# Patient Record
Sex: Female | Born: 2000 | Race: Black or African American | Hispanic: No | Smoking: Never smoker
Health system: Southern US, Community
[De-identification: ages and names within clinical notes are randomized; demographics above are authoritative.]

## PROBLEM LIST (undated history)

## (undated) DIAGNOSIS — K219 Gastro-esophageal reflux disease without esophagitis: Secondary | ICD-10-CM

## (undated) DIAGNOSIS — M549 Dorsalgia, unspecified: Secondary | ICD-10-CM

## (undated) DIAGNOSIS — R1110 Vomiting, unspecified: Secondary | ICD-10-CM

## (undated) DIAGNOSIS — F419 Anxiety disorder, unspecified: Secondary | ICD-10-CM

## (undated) DIAGNOSIS — R51 Headache: Secondary | ICD-10-CM

## (undated) DIAGNOSIS — F329 Major depressive disorder, single episode, unspecified: Secondary | ICD-10-CM

## (undated) DIAGNOSIS — J302 Other seasonal allergic rhinitis: Secondary | ICD-10-CM

## (undated) DIAGNOSIS — R519 Headache, unspecified: Secondary | ICD-10-CM

## (undated) DIAGNOSIS — R002 Palpitations: Secondary | ICD-10-CM

## (undated) DIAGNOSIS — K591 Functional diarrhea: Secondary | ICD-10-CM

## (undated) DIAGNOSIS — N946 Dysmenorrhea, unspecified: Secondary | ICD-10-CM

## (undated) DIAGNOSIS — F32A Depression, unspecified: Secondary | ICD-10-CM

## (undated) DIAGNOSIS — F509 Eating disorder, unspecified: Secondary | ICD-10-CM

## (undated) HISTORY — DX: Headache: R51

## (undated) HISTORY — DX: Palpitations: R00.2

## (undated) HISTORY — DX: Dysmenorrhea, unspecified: N94.6

## (undated) HISTORY — PX: DENTAL SURGERY: SHX609

## (undated) HISTORY — DX: Depression, unspecified: F32.A

## (undated) HISTORY — DX: Gastro-esophageal reflux disease without esophagitis: K21.9

## (undated) HISTORY — DX: Functional diarrhea: K59.1

## (undated) HISTORY — DX: Eating disorder, unspecified: F50.9

## (undated) HISTORY — DX: Anxiety disorder, unspecified: F41.9

## (undated) HISTORY — DX: Major depressive disorder, single episode, unspecified: F32.9

## (undated) HISTORY — DX: Vomiting, unspecified: R11.10

## (undated) HISTORY — DX: Headache, unspecified: R51.9

## (undated) HISTORY — DX: Dorsalgia, unspecified: M54.9

---
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
## 2018-02-18 ENCOUNTER — Ambulatory Visit: Payer: Medicaid Other

## 2018-02-23 ENCOUNTER — Ambulatory Visit: Payer: Medicaid Other

## 2018-02-26 ENCOUNTER — Ambulatory Visit: Payer: Medicaid Other

## 2018-02-26 ENCOUNTER — Telehealth: Payer: Self-pay | Admitting: Obstetrics & Gynecology

## 2018-02-26 DIAGNOSIS — Z30013 Encounter for initial prescription of injectable contraceptive: Secondary | ICD-10-CM

## 2018-03-01 ENCOUNTER — Other Ambulatory Visit: Payer: Self-pay

## 2018-03-01 DIAGNOSIS — Z30013 Encounter for initial prescription of injectable contraceptive: Secondary | ICD-10-CM

## 2018-03-01 MED ORDER — MEDROXYPROGESTERONE ACETATE 150 MG/ML IM SUSY: 150.0000 mg | PREFILLED_SYRINGE | INTRAMUSCULAR | 0 refills | Status: DC

## 2018-03-01 NOTE — Telephone Encounter (Signed)
Done

## 2018-03-01 NOTE — Telephone Encounter (Signed)
Pt states [facility] advised her she didn't have any refills of her birth control. Cb#[phone]

## 2018-03-01 NOTE — Telephone Encounter (Signed)
LMVM to notify pt she is past due for Annual. She was granted a refill 11/26/17 to give her time to get in for an appointment. Advised to contact office to schedule appointment for AE.

## 2018-03-01 NOTE — Telephone Encounter (Signed)
Patient is schedule 03/04/18 with AMS for Annual exam. Patient requesting refill on Depo.

## 2018-03-04 ENCOUNTER — Ambulatory Visit: Payer: Medicaid Other | Admitting: Obstetrics and Gynecology

## 2018-03-16 ENCOUNTER — Ambulatory Visit: Payer: Medicaid Other | Admitting: Obstetrics and Gynecology

## 2018-04-13 ENCOUNTER — Ambulatory Visit (INDEPENDENT_AMBULATORY_CARE_PROVIDER_SITE_OTHER): Payer: Medicaid Other | Admitting: Obstetrics and Gynecology

## 2018-04-13 ENCOUNTER — Other Ambulatory Visit (HOSPITAL_COMMUNITY)
Admission: RE | Admit: 2018-04-13 | Discharge: 2018-04-13 | Disposition: A | Discharge status: Home / Self Care | Payer: Medicaid Other | Source: Ambulatory Visit | Attending: Obstetrics and Gynecology | Admitting: Obstetrics and Gynecology

## 2018-04-13 ENCOUNTER — Encounter: Payer: Self-pay | Admitting: Obstetrics and Gynecology

## 2018-04-13 VITALS — BP 88/40 | HR 99 | Wt 81.0 lb

## 2018-04-13 DIAGNOSIS — Z113 Encounter for screening for infections with a predominantly sexual mode of transmission: Secondary | ICD-10-CM

## 2018-04-13 DIAGNOSIS — Z3042 Encounter for surveillance of injectable contraceptive: Secondary | ICD-10-CM

## 2018-04-13 DIAGNOSIS — Z01419 Encounter for gynecological examination (general) (routine) without abnormal findings: Secondary | ICD-10-CM

## 2018-04-13 DIAGNOSIS — Z30013 Encounter for initial prescription of injectable contraceptive: Secondary | ICD-10-CM

## 2018-04-13 DIAGNOSIS — Z0000 Encounter for general adult medical examination without abnormal findings: Secondary | ICD-10-CM | POA: Diagnosis not present

## 2018-04-13 MED ORDER — MEDROXYPROGESTERONE ACETATE 150 MG/ML IM SUSY: 150.0000 mg | PREFILLED_SYRINGE | INTRAMUSCULAR | 3 refills | Status: DC

## 2018-04-13 NOTE — Progress Notes (Signed)
Gynecology Annual Exam  PCP: Patient, No Pcp Per  Chief Complaint:  Chief Complaint  Patient presents with  . Gynecologic Exam    History of Present Illness: Patient is a 17 y.o. G0P0000 presents for annual exam. The patient has no complaints today.   LMP: Patient's last menstrual period was 03/19/2018. Absent menses other than occasional breakthrough bleeding on depo provera.  The patient is sexually active. She currently uses Depo-Provera injections for contraception. She denies dyspareunia.    The patient wears seatbelts: yes.  The patient has regular exercise: not asked.    The patient denies current symptoms of depression.    Review of Systems: ROS  Past Medical History:  Past Medical History:  Diagnosis Date  . Acid reflux   . Anxiety   . Backache   . Depression   . Diarrhea, functional   . Dysmenorrhea   . Eating disorder   . Head ache   . Heart palpitations   . Seasonal allergies   . Vomiting     Past Surgical History:  Past Surgical History:  Procedure Laterality Date  . colonscopy  06/2015  . DENTAL SURGERY      Gynecologic History:  Patient's last menstrual period was 03/19/2018. Contraception: Depo-Provera injections Last Pap: Results were: N/A under 21  Obstetric History: G0P0000  Family History:  Family History  Problem Relation Age of Onset  . Lung cancer Maternal Grandmother   . Stroke Maternal Grandmother   . Diabetes Maternal Grandfather   . Stroke Maternal Grandfather   . Hypertension Maternal Grandfather   . Cancer Other        LUNG; SKIN CA IN SITU    Social History:  Social History   Socioeconomic History  . Marital status: Single    Spouse name: Not on file  . Number of children: 0  . Years of education: 3212  . Highest education level: Not on file  Occupational History  . Occupation: Consulting civil engineerstudent  Social Needs  . Financial resource strain: Not on file  . Food insecurity:    Worry: Not on file    Inability: Not on  file  . Transportation needs:    Medical: Not on file    Non-medical: Not on file  Tobacco Use  . Smoking status: Never Smoker  . Smokeless tobacco: Never Used  Substance and Sexual Activity  . Alcohol use: No  . Drug use: No  . Sexual activity: Yes    Birth control/protection: Pill  Lifestyle  . Physical activity:    Days per week: Not on file    Minutes per session: Not on file  . Stress: Not on file  Relationships  . Social connections:    Talks on phone: Not on file    Gets together: Not on file    Attends religious service: Not on file    Active member of club or organization: Not on file    Attends meetings of clubs or organizations: Not on file    Relationship status: Not on file  . Intimate partner violence:    Fear of current or ex partner: Not on file    Emotionally abused: Not on file    Physically abused: Not on file    Forced sexual activity: Not on file  Other Topics Concern  . Not on file  Social History Narrative  . Not on file    Allergies:  Allergies  Allergen Reactions  . Buspirone Other (See Comments)  agitation     Medications: Prior to Admission medications   Medication Sig Start Date End Date Taking? Authorizing Provider  ibuprofen (ADVIL,MOTRIN) 600 MG tablet Take 1 tablet (600 mg total) by mouth every 8 (eight) hours as needed. 02/18/17  Yes Emily Filbert, MD  ipratropium (ATROVENT) 0.03 % nasal spray Place 2 sprays into both nostrils 3 (three) times daily. 02/08/17  Yes [provider]  medroxyPROGESTERone Acetate 150 MG/ML SUSY Inject 1 mL (150 mg total) into the muscle every 3 (three) months. 03/01/18  Yes Vena Austria, MD  NEXIUM 40 MG capsule Take 40 mg by mouth daily. 07/13/16  Yes [provider]  albuterol (PROVENTIL HFA;VENTOLIN HFA) 108 (90 Base) MCG/ACT inhaler Inhale 2 puffs into the lungs every 4 (four) hours as needed for wheezing or shortness of breath.     [provider]    medroxyPROGESTERone (PROVERA) 10 MG tablet Take 1 tablet (10 mg total) by mouth daily. Patient not taking: Reported on 04/13/2018 02/18/17   Emily Filbert, MD    Physical Exam Vitals: Blood pressure (!) 88/40, pulse 99, weight 81 lb (36.7 kg), last menstrual period 03/19/2018.  General: NAD HEENT: normocephalic, anicteric Thyroid: no enlargement, no palpable nodules Pulmonary: No increased work of breathing, CTAB Cardiovascular: RRR, distal pulses 2+ Breast: Breast symmetrical, no tenderness, no palpable nodules or masses, no skin or nipple retraction present, no nipple discharge.  No axillary or supraclavicular lymphadenopathy. Abdomen: NABS, soft, non-tender, non-distended.  Umbilicus without lesions.  No hepatomegaly, splenomegaly or masses palpable. No evidence of hernia  Genitourinary:  External: Normal external female genitalia.  Normal urethral meatus, normal Bartholin's and Skene's glands.    Vagina: Normal vaginal mucosa, no evidence of prolapse.    Cervix: Grossly normal in appearance, no bleeding  Uterus: Non-enlarged, mobile, normal contour.  No CMT  Adnexa: ovaries non-enlarged, no adnexal masses  Rectal: deferred  Lymphatic: no evidence of inguinal lymphadenopathy Extremities: no edema, erythema, or tenderness Neurologic: Grossly intact Psychiatric: mood appropriate, affect full  Female chaperone present for pelvic and breast  portions of the physical exam    Assessment: 17 y.o. G0P0000 routine annual exam  Plan: Problem List Items Addressed This Visit    None    Visit Diagnoses    Encounter for gynecological examination without abnormal finding    -  Primary   Relevant Orders   Cervicovaginal ancillary only   HEP, RPR, HIV Panel   Surveillance for Depo-Provera contraception       Routine screening for STI (sexually transmitted infection)       Relevant Orders   Cervicovaginal ancillary only   HEP, RPR, HIV Panel   Encounter for initial  prescription of injectable contraceptive       Pt wants to try depo. RTO for UPT and first injection. Abstinence condoms till depo start and 7 days after. Increase calcium. F/u prn.    Relevant Medications   medroxyPROGESTERone Acetate 150 MG/ML SUSY      1)Gardasil Series discussed and if applicable offered to patient - Patient has previously completed 3 shot series   2) STI screening  wasoffered and accepted  3)  ASCCP guidelines and rational discussed.  Patient opts for starting age 25, 3 year screening interval  4) Contraception - the patient is currently using  Depo-Provera injections.  She is happy with her current form of contraception and plans to continue We discussed safe sex practices to reduce her furture risk of STI's.    5)  Return in about 1 year (around 04/14/2019) for annnual (continue nurse visit every 3 months depo provera).   Vena Austria, MD, [facility], [facility] 04/13/2018, 10:03 PM

## 2018-04-14 LAB — CERVICOVAGINAL ANCILLARY ONLY
Chlamydia: NEGATIVE
Neisseria Gonorrhea: NEGATIVE
TRICH (WINDOWPATH): NEGATIVE

## 2018-04-15 LAB — HEP, RPR, HIV PANEL
HIV SCREEN 4TH GENERATION: NONREACTIVE
Hepatitis B Surface Ag: NEGATIVE
RPR Ser Ql: NONREACTIVE

## 2018-04-16 ENCOUNTER — Ambulatory Visit (INDEPENDENT_AMBULATORY_CARE_PROVIDER_SITE_OTHER): Payer: Medicaid Other

## 2018-04-16 DIAGNOSIS — Z3202 Encounter for pregnancy test, result negative: Secondary | ICD-10-CM | POA: Diagnosis not present

## 2018-04-16 DIAGNOSIS — N912 Amenorrhea, unspecified: Secondary | ICD-10-CM

## 2018-04-16 DIAGNOSIS — Z3042 Encounter for surveillance of injectable contraceptive: Secondary | ICD-10-CM | POA: Diagnosis not present

## 2018-04-16 LAB — POCT URINE PREGNANCY: Preg Test, Ur: NEGATIVE

## 2018-04-16 MED ORDER — MEDROXYPROGESTERONE ACETATE 150 MG/ML IM SUSP
150.0000 mg | Freq: Once | INTRAMUSCULAR | Status: AC
  Administered 2018-04-16: 150 mg via INTRAMUSCULAR

## 2018-04-16 NOTE — Progress Notes (Signed)
Pt presents for Depo Provera injection today. Last Received 11/26/17. Patient is not currently having menses. Pregnancy test performed per protocol (Neg). Injection given IM RUOQ.

## 2018-06-14 ENCOUNTER — Encounter: Payer: Self-pay | Admitting: Emergency Medicine

## 2018-06-14 ENCOUNTER — Other Ambulatory Visit: Payer: Self-pay

## 2018-06-14 ENCOUNTER — Emergency Department: Payer: Medicaid Other

## 2018-06-14 ENCOUNTER — Emergency Department
Admission: EM | Admit: 2018-06-14 | Discharge: 2018-06-14 | Disposition: A | Discharge status: Home / Self Care | Payer: Medicaid Other | Attending: Emergency Medicine | Admitting: Emergency Medicine

## 2018-06-14 DIAGNOSIS — Y999 Unspecified external cause status: Secondary | ICD-10-CM | POA: Insufficient documentation

## 2018-06-14 DIAGNOSIS — Z79899 Other long term (current) drug therapy: Secondary | ICD-10-CM | POA: Diagnosis not present

## 2018-06-14 DIAGNOSIS — W540XXA Bitten by dog, initial encounter: Secondary | ICD-10-CM | POA: Diagnosis not present

## 2018-06-14 DIAGNOSIS — Y929 Unspecified place or not applicable: Secondary | ICD-10-CM | POA: Diagnosis not present

## 2018-06-14 DIAGNOSIS — S61451A Open bite of right hand, initial encounter: Secondary | ICD-10-CM

## 2018-06-14 DIAGNOSIS — S81852A Open bite, left lower leg, initial encounter: Secondary | ICD-10-CM | POA: Diagnosis not present

## 2018-06-14 DIAGNOSIS — S61452A Open bite of left hand, initial encounter: Secondary | ICD-10-CM

## 2018-06-14 DIAGNOSIS — Y9389 Activity, other specified: Secondary | ICD-10-CM | POA: Insufficient documentation

## 2018-06-14 DIAGNOSIS — S6991XA Unspecified injury of right wrist, hand and finger(s), initial encounter: Secondary | ICD-10-CM | POA: Diagnosis present

## 2018-06-14 MED ORDER — TRAMADOL HCL 50 MG PO TABS: 50.0000 mg | ORAL_TABLET | Freq: Four times a day (QID) | ORAL | 0 refills | Status: DC | PRN

## 2018-06-14 MED ORDER — AMOXICILLIN-POT CLAVULANATE 875-125 MG PO TABS: 1.0000 | ORAL_TABLET | Freq: Two times a day (BID) | ORAL | 0 refills | Status: DC

## 2018-06-14 MED ORDER — BACITRACIN-NEOMYCIN-POLYMYXIN 400-5-5000 EX OINT: TOPICAL_OINTMENT | Freq: Once | CUTANEOUS | Status: DC

## 2018-06-14 MED ORDER — TETANUS-DIPHTH-ACELL PERTUSSIS 5-2.5-18.5 LF-MCG/0.5 IM SUSP: 0.5000 mL | Freq: Once | INTRAMUSCULAR | Status: DC

## 2018-06-14 MED ORDER — TRAMADOL HCL 50 MG PO TABS: 50.0000 mg | ORAL_TABLET | Freq: Once | ORAL | Status: DC

## 2018-06-14 NOTE — ED Provider Notes (Signed)
[facility] Emergency Department Provider Note  ____________________________________________  Time seen: Approximately 2:54 PM  I have reviewed the triage vital signs and the nursing notes.   HISTORY  Chief Complaint Animal Bite   HPI Lindsay Bennett is a 18 y.o. female for treatment and evaluation of multiple dog bites. Her dogs were fighting and she tried to break them up. Dogs are [place] and are up to date on vaccinations. Patient not sure if her Tdap is current.  Past Medical History:  Diagnosis Date  . Acid reflux   . Anxiety   . Backache   . Depression   . Diarrhea, functional   . Dysmenorrhea   . Eating disorder   . Head ache   . Heart palpitations   . Seasonal allergies   . Vomiting     Patient Active Problem List   Diagnosis Date Noted  . Raynaud's disease 10/24/2016  . Anxiety 06/16/2016  . Chronic migraine 06/16/2016  . Weight loss 05/05/2016  . Patient underweight 12/17/2015  . Chest pain 01/17/2014  . Dizziness 01/17/2014  . Body mass index, pediatric, less than 5th percentile for age 28/22/2015  . Awareness of heartbeats 01/17/2014  . Syncope and collapse 01/17/2014  . Cephalalgia 09/20/2013    Past Surgical History:  Procedure Laterality Date  . colonscopy  06/2015  . DENTAL SURGERY      Prior to Admission medications   Medication Sig Start Date End Date Taking? Authorizing Provider  albuterol (PROVENTIL HFA;VENTOLIN HFA) 108 (90 Base) MCG/ACT inhaler Inhale 2 puffs into the lungs every 4 (four) hours as needed for wheezing or shortness of breath.     [provider]  amoxicillin-clavulanate (AUGMENTIN) 875-125 MG tablet Take 1 tablet by mouth 2 (two) times daily. 06/14/18   Curvin Hunger, Rulon Eisenmenger B, FNP  ibuprofen (ADVIL,MOTRIN) 600 MG tablet Take 1 tablet (600 mg total) by mouth every 8 (eight) hours as needed. 02/18/17   Emily Filbert, MD  ipratropium (ATROVENT) 0.03 % nasal spray Place 2 sprays into both  nostrils 3 (three) times daily. 02/08/17   [provider]  medroxyPROGESTERone Acetate 150 MG/ML SUSY Inject 1 mL (150 mg total) into the muscle every 3 (three) months. 04/13/18   Vena Austria, MD  NEXIUM 40 MG capsule Take 40 mg by mouth daily. 07/13/16   [provider]  traMADol (ULTRAM) 50 MG tablet Take 1 tablet (50 mg total) by mouth every 6 (six) hours as needed. 06/14/18   Kem Boroughs B, FNP    Allergies Buspirone  Family History  Problem Relation Age of Onset  . Lung cancer Maternal Grandmother   . Stroke Maternal Grandmother   . Diabetes Maternal Grandfather   . Stroke Maternal Grandfather   . Hypertension Maternal Grandfather   . Cancer Other        LUNG; SKIN CA IN SITU    Social History Social History   Tobacco Use  . Smoking status: Never Smoker  . Smokeless tobacco: Never Used  Substance Use Topics  . Alcohol use: No  . Drug use: No    Review of Systems  Constitutional: negative for fever. Respiratory: Negative for cough or shortness of breath.  Musculoskeletal: Negative for myalgias Skin: Positive for puncture wounds Neurological: Negative for numbness or paresthesias. ____________________________________________   PHYSICAL EXAM:  VITAL SIGNS: ED Triage Vitals  Enc Vitals Group     BP 06/14/18 1333 (!) 100/55     Pulse Rate 06/14/18 1333 78  Resp 06/14/18 1333 18     Temp 06/14/18 1333 98.1 F (36.7 C)     Temp Source 06/14/18 1333 Oral     SpO2 06/14/18 1333 98 %     Weight 06/14/18 1335 80 lb (36.3 kg)     Height 06/14/18 1335 5' (1.524 m)     Head Circumference --      Peak Flow --      Pain Score 06/14/18 1335 9     Pain Loc --      Pain Edu? --      Excl. in GC? --      Constitutional: Well appearing. Eyes: Conjunctivae are clear without discharge or drainage. Nose: No rhinorrhea noted. Mouth/Throat: Airway is patent.  Neck: No stridor. Unrestricted range of motion observed. Cardiovascular: Capillary  refill is <3 seconds.  Respiratory: Respirations are even and unlabored.. Musculoskeletal: Unrestricted range of motion demonstrated. Neurologic: Awake, alert, and oriented x 4.  Skin:  Puncture wound to the dorsal aspect of the right and left hand, abrasions to the lateral left knee.  ____________________________________________   LABS (all labs ordered are listed, but only abnormal results are displayed)  Labs Reviewed - No data to display ____________________________________________  EKG  Not indicated. ____________________________________________  RADIOLOGY  Image of the right hand is negative for acute bony abnormality. ____________________________________________   PROCEDURES  Procedures ____________________________________________   INITIAL IMPRESSION / ASSESSMENT AND PLAN / ED COURSE  Lindsay Bennett is a 19 y.o. female sent to the emergency department for treatment and evaluation after being bitten by her dog.  Wounds were soaked in Betadine and normal saline for about 10 minutes.  Neosporin and sterile bandages applied afterward.  She will be prescribed Augmentin and tramadol.  Wound care was discussed.  Tdap booster was also administered while here.  She is to follow-up with primary care for any concern of infection.  She is to return to the emergency department for symptoms of concern if she is unable to schedule appointment.   Medications  Tdap (BOOSTRIX) injection 0.5 mL (has no administration in time range)  neomycin-bacitracin-polymyxin (NEOSPORIN) ointment packet (has no administration in time range)  traMADol (ULTRAM) tablet 50 mg (has no administration in time range)     Pertinent labs & imaging results that were available during my care of the patient were reviewed by me and considered in my medical decision making (see chart for details).  ____________________________________________   FINAL CLINICAL IMPRESSION(S) / ED DIAGNOSES  Final diagnoses:   Dog bite of right hand, initial encounter  Dog bite of left hand, initial encounter  Dog bite of left lower leg, initial encounter    ED Discharge Orders         Ordered    amoxicillin-clavulanate (AUGMENTIN) 875-125 MG tablet  2 times daily,   Status:  Discontinued     06/14/18 1558    traMADol (ULTRAM) 50 MG tablet  Every 6 hours PRN     06/14/18 1558    amoxicillin-clavulanate (AUGMENTIN) 875-125 MG tablet  2 times daily     06/14/18 1559           Note:  This document was prepared using Dragon voice recognition software and may include unintentional dictation errors.    Chinita Pester, FNP 06/14/18 1559    Nita Sickle, MD 06/15/18 [phone]

## 2018-06-14 NOTE — ED Triage Notes (Signed)
Bitten both hands and leg by own dog. States has had vaccines.

## 2018-06-14 NOTE — Discharge Instructions (Signed)
Please follow up with primary care for concerns. If unable to schedule an appointment, return to the ER.  Take the antibiotics until finished. Apply the antibiotic 2 times per day.

## 2018-06-14 NOTE — ED Notes (Signed)
Left w/o receiving meds or DC instructions.

## 2018-06-14 NOTE — ED Notes (Signed)
Both hand were unwrapped and they are soaking in a solution of betadine, surgical scrub and normal saline.  Bleeding controlled.

## 2018-06-14 NOTE — ED Notes (Addendum)
See triage note  States she was bitten by her dog  Punctures noted to both hands   Bruising and swelling noted to right hand

## 2018-07-09 ENCOUNTER — Ambulatory Visit: Payer: Medicaid Other

## 2018-08-25 ENCOUNTER — Other Ambulatory Visit: Payer: Self-pay

## 2018-08-25 ENCOUNTER — Ambulatory Visit (INDEPENDENT_AMBULATORY_CARE_PROVIDER_SITE_OTHER): Payer: Medicaid Other

## 2018-08-25 DIAGNOSIS — Z3042 Encounter for surveillance of injectable contraceptive: Secondary | ICD-10-CM

## 2018-08-25 DIAGNOSIS — Z3202 Encounter for pregnancy test, result negative: Secondary | ICD-10-CM

## 2018-08-25 DIAGNOSIS — N912 Amenorrhea, unspecified: Secondary | ICD-10-CM

## 2018-08-25 LAB — POCT URINE PREGNANCY: Preg Test, Ur: NEGATIVE

## 2018-08-25 MED ORDER — MEDROXYPROGESTERONE ACETATE 150 MG/ML IM SUSP
150.0000 mg | Freq: Once | INTRAMUSCULAR | Status: AC
  Administered 2018-08-25: 150 mg via INTRAMUSCULAR

## 2018-08-25 NOTE — Progress Notes (Signed)
Patient presents today for Depo Provera injection today out of date range. Patient not currently on menses. Pregnancy test performed per protocol (negative). Given IM LUOQ. Patient tolerated well.

## 2018-11-08 ENCOUNTER — Telehealth: Payer: Self-pay

## 2018-11-08 NOTE — Telephone Encounter (Signed)
Pt calling; wants to talk about estrogen pills.  [phone]

## 2018-11-08 NOTE — Telephone Encounter (Signed)
Please call patient and let her know she will need an appointment to discuss starting medication

## 2018-11-09 NOTE — Telephone Encounter (Signed)
Patient is schedule 11/11/18 with AMS . Patient aware of date, location and time

## 2018-11-11 ENCOUNTER — Encounter: Payer: Self-pay | Admitting: Obstetrics and Gynecology

## 2018-11-11 ENCOUNTER — Ambulatory Visit: Payer: Medicaid Other | Admitting: Obstetrics and Gynecology

## 2018-11-11 ENCOUNTER — Ambulatory Visit (INDEPENDENT_AMBULATORY_CARE_PROVIDER_SITE_OTHER): Payer: Medicaid Other | Admitting: Obstetrics and Gynecology

## 2018-11-11 ENCOUNTER — Other Ambulatory Visit: Payer: Self-pay

## 2018-11-11 DIAGNOSIS — R636 Underweight: Secondary | ICD-10-CM | POA: Diagnosis not present

## 2018-11-11 MED ORDER — CYPROHEPTADINE HCL 4 MG PO TABS: ORAL_TABLET | ORAL | 0 refills | Status: DC

## 2018-11-11 NOTE — Progress Notes (Signed)
I connected with Lindsay Bennett  on 11/12/18 at  8:50 AM EDT by telephone and verified that I am speaking with the correct person using two identifiers.   I discussed the limitations, risks, security and privacy concerns of performing an evaluation and management service by telephone and the availability of in person appointments. I also discussed with the patient that there may be a patient responsible charge related to this service. The patient expressed understanding and agreed to proceed.  The patient was at home I spoke with the patient from my workstation phone The names of people involved in this encounter were: Lindsay Bennett , and [name]   Obstetrics & Gynecology Office Visit   Chief Complaint:  Chief Complaint  Patient presents with  . 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  Patient has always been small of stature, has had extensive work up in past for failure to thrive with no identifiable etiology, including normal karyotype on 09/28/2009 and SHOX gene analysis   As a result of her low BMI she is also concerned that she never had any significant breast development.  On prior exam normal adrenarche and telarche.  She also does not suffer from primary amenorrhea.  We discussed that there is no indication for the use of estrogen to a) promote growth b) promote breast development.  Her main concern is her weight.     Review of Systems: Review of Systems  Constitutional: Negative.   Genitourinary: Negative.   All other systems reviewed and are negative.    Past Medical History:  Past Medical History:  Diagnosis Date  . Acid reflux   . Anxiety   . Backache   . Depression   . Diarrhea, functional   . Dysmenorrhea   . Eating disorder   . Head ache   . Heart  palpitations   . Seasonal allergies   . Vomiting     Past Surgical History:  Past Surgical History:  Procedure Laterality Date  . colonscopy  06/2015  . DENTAL SURGERY      Gynecologic History: No LMP recorded.  Obstetric History: G0P0000  Family History:  Family History  Problem Relation Age of Onset  . Lung cancer Maternal Grandmother   . Stroke Maternal Grandmother   . Diabetes Maternal Grandfather   . Stroke Maternal Grandfather   . Hypertension Maternal Grandfather   . Cancer Other        LUNG; SKIN CA IN SITU    Social History:  Social History   Socioeconomic History  . Marital status: Single    Spouse name: Not on file  . Number of children: 0  . Years of education: 65  . Highest education level: Not on file  Occupational History  . Occupation: Ship broker  Social Needs  . Financial resource strain: Not on file  . Food insecurity    Worry: Not on file    Inability: Not on file  . Transportation needs    Medical: Not on file    Non-medical: Not on file  Tobacco Use  . Smoking status: Never Smoker  . Smokeless tobacco: Never Used  Substance and Sexual Activity  . Alcohol use: No  . Drug use: No  . Sexual activity: Yes    Birth control/protection:  Injection  Lifestyle  . Physical activity    Days per week: Not on file    Minutes per session: Not on file  . Stress: Not on file  Relationships  . Social Musicianconnections    Talks on phone: Not on file    Gets together: Not on file    Attends religious service: Not on file    Active member of club or organization: Not on file    Attends meetings of clubs or organizations: Not on file    Relationship status: Not on file  . Intimate partner violence    Fear of current or ex partner: Not on file    Emotionally abused: Not on file    Physically abused: Not on file    Forced sexual activity: Not on file  Other Topics Concern  . Not on file  Social History Narrative  . Not on file    Allergies:   Allergies  Allergen Reactions  . Buspirone Other (See Comments)    agitation     Medications: Prior to Admission medications   Medication Sig Start Date End Date Taking? Authorizing Provider  ibuprofen (ADVIL,MOTRIN) 600 MG tablet Take 1 tablet (600 mg total) by mouth every 8 (eight) hours as needed. 02/18/17  Yes Emily FilbertWilliams, Jonathan E, MD  ipratropium (ATROVENT) 0.03 % nasal spray Place 2 sprays into both nostrils 3 (three) times daily. 02/08/17  Yes [provider]  medroxyPROGESTERone Acetate 150 MG/ML SUSY Inject 1 mL (150 mg total) into the muscle every 3 (three) months. 04/13/18  Yes Vena AustriaStaebler, Malakhi Markwood, MD  NEXIUM 40 MG capsule Take 40 mg by mouth daily. 07/13/16  Yes [provider]    Physical Exam Vitals: 80lbs  No physical exam as this was a remote telephone visit to promote social distancing during the current COVID-19 Pandemic  Assessment: 18 y.o. G0P0000 with concern for being underweight  Plan: Problem List Items Addressed This Visit    None    Visit Diagnoses    Severely underweight adult    -  Primary     1) Underweight - discussed no gynecologic etiology, and extensive work up to date with negative findings.  I discussed that we could try an appetite stimulant.  Megace is an option but she is already on Depo Provera so adding additional progestin does not seem warranted and is likley to result in excessive breakthrough bleeding.  Will trial on periactin and follow up in 4 weeks to assess if any improvement in appetite.    2) Telephone Time 11:1142min  3) Return in about 4 weeks (around 12/09/2018) for medication follow up.   Vena AustriaAndreas Neosha Switalski, MD, Merlinda FrederickFACOG [facility], [facility]

## 2018-11-17 ENCOUNTER — Ambulatory Visit (INDEPENDENT_AMBULATORY_CARE_PROVIDER_SITE_OTHER): Payer: Medicaid Other

## 2018-11-17 ENCOUNTER — Other Ambulatory Visit: Payer: Self-pay

## 2018-11-17 DIAGNOSIS — Z3042 Encounter for surveillance of injectable contraceptive: Secondary | ICD-10-CM

## 2018-11-17 MED ORDER — MEDROXYPROGESTERONE ACETATE 150 MG/ML IM SUSP
150.0000 mg | Freq: Once | INTRAMUSCULAR | Status: AC
  Administered 2018-11-17: 11:00:00 150 mg via INTRAMUSCULAR

## 2018-11-17 NOTE — Progress Notes (Signed)
Pt here for depo which was given IM right glut.  NDC# [address]-2 

## 2018-12-07 ENCOUNTER — Ambulatory Visit: Payer: Medicaid Other | Admitting: Obstetrics and Gynecology

## 2018-12-09 ENCOUNTER — Other Ambulatory Visit: Payer: Self-pay | Admitting: Obstetrics and Gynecology

## 2018-12-16 ENCOUNTER — Other Ambulatory Visit: Payer: Self-pay | Admitting: Obstetrics and Gynecology

## 2018-12-16 ENCOUNTER — Telehealth: Payer: Self-pay

## 2018-12-16 MED ORDER — CYPROHEPTADINE HCL 4 MG PO TABS: 4.0000 mg | ORAL_TABLET | Freq: Four times a day (QID) | ORAL | 11 refills | Status: DC

## 2018-12-16 NOTE — Telephone Encounter (Signed)
[facility] faxed over a refill request on Cyproheptadine 4mg  tablets

## 2019-02-09 ENCOUNTER — Other Ambulatory Visit: Payer: Self-pay

## 2019-02-09 ENCOUNTER — Ambulatory Visit (INDEPENDENT_AMBULATORY_CARE_PROVIDER_SITE_OTHER): Payer: Medicaid Other

## 2019-02-09 DIAGNOSIS — Z3042 Encounter for surveillance of injectable contraceptive: Secondary | ICD-10-CM

## 2019-02-09 MED ORDER — MEDROXYPROGESTERONE ACETATE 150 MG/ML IM SUSP
150.0000 mg | Freq: Once | INTRAMUSCULAR | Status: AC
  Administered 2019-02-09: 11:00:00 150 mg via INTRAMUSCULAR

## 2019-02-09 NOTE — Progress Notes (Signed)
Patient presents today for Depo Provera injection within dates. Given IM LUOQ. Patient tolerated well. 

## 2019-05-04 ENCOUNTER — Ambulatory Visit: Payer: Medicaid Other

## 2019-05-11 ENCOUNTER — Ambulatory Visit: Payer: Medicaid Other

## 2019-05-11 ENCOUNTER — Other Ambulatory Visit: Payer: Self-pay | Admitting: Obstetrics and Gynecology

## 2019-05-11 ENCOUNTER — Ambulatory Visit (INDEPENDENT_AMBULATORY_CARE_PROVIDER_SITE_OTHER): Payer: Medicaid Other

## 2019-05-11 ENCOUNTER — Other Ambulatory Visit: Payer: Self-pay

## 2019-05-11 DIAGNOSIS — Z30013 Encounter for initial prescription of injectable contraceptive: Secondary | ICD-10-CM

## 2019-05-11 DIAGNOSIS — Z3042 Encounter for surveillance of injectable contraceptive: Secondary | ICD-10-CM

## 2019-05-11 MED ORDER — MEDROXYPROGESTERONE ACETATE 150 MG/ML IM SUSP
150.0000 mg | Freq: Once | INTRAMUSCULAR | Status: AC
  Administered 2019-05-11: 10:00:00 150 mg via INTRAMUSCULAR

## 2019-05-11 NOTE — Progress Notes (Signed)
Pt here for depo which was given IM right glut.  NDC# [address]-2 

## 2019-08-07 NOTE — Progress Notes (Deleted)
PCP:  Patient, No Pcp Per   No chief complaint on file.    HPI:      [name] is a 19 y.o. G0P0000 who LMP was No LMP recorded., presents today for her annual examination.  Her menses are {norm/abn:715}, lasting {number:22536} days.  Dysmenorrhea {dysmen:716}. She {does:18564} have intermenstrual bleeding.  Sex activity: single partner, contraception - Depo-Provera injections.  Last Pap: {IOXB:353299242}  Results were: {norm/abn:16707::"no abnormalities"} /neg HPV DNA *** Hx of STDs: {STD hx:14358}  Last mammogram: {date:304500300}  Results were: {norm/abn:13465} There is no FH of breast cancer. There is no FH of ovarian cancer. The patient {does:18564} do self-breast exams.  Tobacco use: {tob:20664} Alcohol use: {Alcohol:11675} No drug use.  Exercise: {exercise:31265}  She {does:18564} get adequate calcium and Vitamin D in her diet.   Past Medical History:  Diagnosis Date  . Acid reflux   . Anxiety   . Backache   . Depression   . Diarrhea, functional   . Dysmenorrhea   . Eating disorder   . Head ache   . Heart palpitations   . Seasonal allergies   . Vomiting     Past Surgical History:  Procedure Laterality Date  . colonscopy  06/2015  . DENTAL SURGERY      Family History  Problem Relation Age of Onset  . Lung cancer Maternal Grandmother   . Stroke Maternal Grandmother   . Diabetes Maternal Grandfather   . Stroke Maternal Grandfather   . Hypertension Maternal Grandfather   . Cancer Other        LUNG; SKIN CA IN SITU    Social History   Socioeconomic History  . Marital status: Single    Spouse name: Not on file  . Number of children: 0  . Years of education: 70  . Highest education level: Not on file  Occupational History  . Occupation: Ship broker  Tobacco Use  . Smoking status: Never Smoker  . Smokeless tobacco: Never Used  Substance and Sexual Activity  . Alcohol use: No  . Drug use: No  . Sexual activity: Yes    Birth  control/protection: Injection  Other Topics Concern  . Not on file  Social History Narrative  . Not on file   Social Determinants of Health   Financial Resource Strain:   . Difficulty of Paying Living Expenses:   Food Insecurity:   . Worried About Charity fundraiser in the Last Year:   . Arboriculturist in the Last Year:   Transportation Needs:   . Film/video editor (Medical):   Marland Kitchen Lack of Transportation (Non-Medical):   Physical Activity:   . Days of Exercise per Week:   . Minutes of Exercise per Session:   Stress:   . Feeling of Stress :   Social Connections:   . Frequency of Communication with Friends and Family:   . Frequency of Social Gatherings with Friends and Family:   . Attends Religious Services:   . Active Member of Clubs or Organizations:   . Attends Archivist Meetings:   Marland Kitchen Marital Status:   Intimate Partner Violence:   . Fear of Current or Ex-Partner:   . Emotionally Abused:   Marland Kitchen Physically Abused:   . Sexually Abused:      Current Outpatient Medications:  .  cyproheptadine (PERIACTIN) 4 MG tablet, Take 1 tablet (4 mg total) by mouth 4 (four) times daily., Disp: 120 tablet, Rfl: 11 .  ibuprofen (ADVIL,MOTRIN) 600  MG tablet, Take 1 tablet (600 mg total) by mouth every 8 (eight) hours as needed., Disp: 30 tablet, Rfl: 0 .  ipratropium (ATROVENT) 0.03 % nasal spray, Place 2 sprays into both nostrils 3 (three) times daily., Disp: , Rfl:  .  medroxyPROGESTERone Acetate 150 MG/ML SUSY, INJECT 1 ML (150 MG TOTAL) INTO THE MUSCLE EVERY 3 (THREE) MONTHS., Disp: 1 mL, Rfl: 3 .  NEXIUM 40 MG capsule, Take 40 mg by mouth daily., Disp: , Rfl: 5     ROS:  Review of Systems BREAST: No symptoms   Objective: There were no vitals taken for this visit.   OBGyn Exam  Results: No results found for this or any previous visit (from the past 24 hour(s)).  Assessment/Plan: Encounter for annual routine gynecological examination  Cervical cancer  screening  Screening for STD (sexually transmitted disease)  Encounter for surveillance of injectable contraceptive  No orders of the defined types were placed in this encounter.            GYN counsel {counseling:16159}     F/U  No follow-ups on file.  Charolette Bultman B. Maurie Musco, PA-C 08/07/2019 9:01 PM

## 2019-08-08 ENCOUNTER — Ambulatory Visit: Payer: Medicaid Other | Admitting: Obstetrics and Gynecology

## 2019-08-09 ENCOUNTER — Ambulatory Visit (INDEPENDENT_AMBULATORY_CARE_PROVIDER_SITE_OTHER): Payer: Medicaid Other

## 2019-08-09 ENCOUNTER — Other Ambulatory Visit: Payer: Self-pay

## 2019-08-09 DIAGNOSIS — Z3042 Encounter for surveillance of injectable contraceptive: Secondary | ICD-10-CM

## 2019-08-09 MED ORDER — MEDROXYPROGESTERONE ACETATE 150 MG/ML IM SUSP
150.0000 mg | Freq: Once | INTRAMUSCULAR | Status: AC
Start: 2019-08-09 — End: 2019-08-09
  Administered 2019-08-09: 10:00:00 150 mg via INTRAMUSCULAR

## 2019-08-23 ENCOUNTER — Ambulatory Visit: Payer: Medicaid Other | Admitting: Obstetrics and Gynecology

## 2019-09-09 ENCOUNTER — Ambulatory Visit: Payer: Medicaid Other | Admitting: Obstetrics and Gynecology

## 2019-10-14 ENCOUNTER — Other Ambulatory Visit (HOSPITAL_COMMUNITY)
Admission: RE | Admit: 2019-10-14 | Discharge: 2019-10-14 | Disposition: A | Discharge status: Home / Self Care | Payer: Medicaid Other | Source: Ambulatory Visit | Attending: Obstetrics and Gynecology | Admitting: Obstetrics and Gynecology

## 2019-10-14 ENCOUNTER — Other Ambulatory Visit: Payer: Self-pay

## 2019-10-14 ENCOUNTER — Encounter: Payer: Self-pay | Admitting: Obstetrics and Gynecology

## 2019-10-14 ENCOUNTER — Ambulatory Visit (INDEPENDENT_AMBULATORY_CARE_PROVIDER_SITE_OTHER): Payer: Medicaid Other | Admitting: Obstetrics and Gynecology

## 2019-10-14 VITALS — BP 100/70 | Ht 60.0 in | Wt 78.0 lb

## 2019-10-14 DIAGNOSIS — Z124 Encounter for screening for malignant neoplasm of cervix: Secondary | ICD-10-CM | POA: Insufficient documentation

## 2019-10-14 DIAGNOSIS — Z3042 Encounter for surveillance of injectable contraceptive: Secondary | ICD-10-CM

## 2019-10-14 DIAGNOSIS — Z113 Encounter for screening for infections with a predominantly sexual mode of transmission: Secondary | ICD-10-CM | POA: Diagnosis present

## 2019-10-14 DIAGNOSIS — Z01419 Encounter for gynecological examination (general) (routine) without abnormal findings: Secondary | ICD-10-CM | POA: Diagnosis not present

## 2019-10-14 DIAGNOSIS — Z0000 Encounter for general adult medical examination without abnormal findings: Secondary | ICD-10-CM

## 2019-10-14 MED ORDER — MEDROXYPROGESTERONE ACETATE 150 MG/ML IM SUSY: 150.0000 mg | PREFILLED_SYRINGE | INTRAMUSCULAR | 3 refills | Status: DC

## 2019-10-14 NOTE — Progress Notes (Signed)
PCP:  Patient, No Pcp Per   Chief Complaint  Patient presents with  . Gynecologic Exam     HPI:      [name] is a 19 y.o. G0P0000 whose LMP was No LMP recorded. Patient has had an injection., presents today for her annual examination.  Her menses are absent with depo.  Dysmenorrhea none. She does not have intermenstrual bleeding.  Sex activity: single partner, contraception - Depo-Provera injections.  Last Pap: never due to age Hx of STDs: none  There is no FH of breast cancer. There is no FH of ovarian cancer. The patient does do self-breast exams.  Tobacco use: vapes daily, trying to quit Alcohol use: none No drug use.  Exercise: moderately active  She does not get adequate calcium and Vitamin D in her diet. Unsure if Gardasil done.   Past Medical History:  Diagnosis Date  . Acid reflux   . Anxiety   . Backache   . Depression   . Diarrhea, functional   . Dysmenorrhea   . Eating disorder   . Head ache   . Heart palpitations   . Seasonal allergies   . Vomiting     Past Surgical History:  Procedure Laterality Date  . colonscopy  06/2015  . DENTAL SURGERY      Family History  Problem Relation Age of Onset  . Lung cancer Maternal Grandmother   . Stroke Maternal Grandmother   . Diabetes Maternal Grandfather   . Stroke Maternal Grandfather   . Hypertension Maternal Grandfather   . Cancer Other        LUNG; SKIN CA IN SITU    Social History   Socioeconomic History  . Marital status: Single    Spouse name: Not on file  . Number of children: 0  . Years of education: 69  . Highest education level: Not on file  Occupational History  . Occupation: Ship broker  Tobacco Use  . Smoking status: Never Smoker  . Smokeless tobacco: Never Used  Vaping Use  . Vaping Use: Never used  Substance and Sexual Activity  . Alcohol use: No  . Drug use: No  . Sexual activity: Yes    Birth control/protection: Injection  Other Topics Concern  . Not on file   Social History Narrative  . Not on file   Social Determinants of Health   Financial Resource Strain:   . Difficulty of Paying Living Expenses:   Food Insecurity:   . Worried About Charity fundraiser in the Last Year:   . Arboriculturist in the Last Year:   Transportation Needs:   . Film/video editor (Medical):   Marland Kitchen Lack of Transportation (Non-Medical):   Physical Activity:   . Days of Exercise per Week:   . Minutes of Exercise per Session:   Stress:   . Feeling of Stress :   Social Connections:   . Frequency of Communication with Friends and Family:   . Frequency of Social Gatherings with Friends and Family:   . Attends Religious Services:   . Active Member of Clubs or Organizations:   . Attends Archivist Meetings:   Marland Kitchen Marital Status:   Intimate Partner Violence:   . Fear of Current or Ex-Partner:   . Emotionally Abused:   Marland Kitchen Physically Abused:   . Sexually Abused:      Current Outpatient Medications:  .  cetirizine (ZYRTEC) 10 MG tablet, Take by mouth., Disp: , Rfl:  .  medroxyPROGESTERone Acetate 150 MG/ML SUSY, Inject 1 mL (150 mg total) into the muscle every 3 (three) months., Disp: 1 mL, Rfl: 3     ROS:  Review of Systems  Constitutional: Negative for fatigue, fever and unexpected weight change.  Respiratory: Negative for cough, shortness of breath and wheezing.   Cardiovascular: Negative for chest pain, palpitations and leg swelling.  Gastrointestinal: Negative for blood in stool, constipation, diarrhea, nausea and vomiting.  Endocrine: Negative for cold intolerance, heat intolerance and polyuria.  Genitourinary: Negative for dyspareunia, dysuria, flank pain, frequency, genital sores, hematuria, menstrual problem, pelvic pain, urgency, vaginal bleeding, vaginal discharge and vaginal pain.  Musculoskeletal: Negative for back pain, joint swelling and myalgias.  Skin: Negative for rash.  Neurological: Negative for dizziness, syncope,  light-headedness, numbness and headaches.  Hematological: Negative for adenopathy.  Psychiatric/Behavioral: Negative for agitation, confusion, sleep disturbance and suicidal ideas. The patient is not nervous/anxious.   BREAST: No symptoms   Objective: BP 100/70   Ht 5' (1.524 m)   Wt 78 lb (35.4 kg)   BMI 15.23 kg/m    Physical Exam Constitutional:      Appearance: She is well-developed.  Genitourinary:     Vulva, vagina, cervix, uterus, right adnexa and left adnexa normal.     No vulval lesion or tenderness noted.     No vaginal discharge, erythema or tenderness.     No cervical polyp.     Uterus is not enlarged or tender.     No right or left adnexal mass present.     Right adnexa not tender.     Left adnexa not tender.  Neck:     Thyroid: No thyromegaly.  Cardiovascular:     Rate and Rhythm: Normal rate and regular rhythm.     Heart sounds: Normal heart sounds. No murmur heard.   Pulmonary:     Effort: Pulmonary effort is normal.     Breath sounds: Normal breath sounds.  Chest:     Breasts:        Right: No mass, nipple discharge, skin change or tenderness.        Left: No mass, nipple discharge, skin change or tenderness.  Abdominal:     Palpations: Abdomen is soft.     Tenderness: There is no abdominal tenderness. There is no guarding.  Musculoskeletal:        General: Normal range of motion.     Cervical back: Normal range of motion.  Neurological:     General: No focal deficit present.     Mental Status: She is alert and oriented to person, place, and time.     Cranial Nerves: No cranial nerve deficit.  Skin:    General: Skin is warm and dry.  Psychiatric:        Mood and Affect: Mood normal.        Behavior: Behavior normal.        Thought Content: Thought content normal.        Judgment: Judgment normal.  Vitals reviewed.     Assessment/Plan: Encounter for annual routine gynecological examination  Cervical cancer screening - Plan: Cytology -  PAP  Screening for STD (sexually transmitted disease) - Plan: Cytology - PAP  Encounter for surveillance of injectable contraceptive - Plan: medroxyPROGESTERone Acetate 150 MG/ML SUSY; Rx RF. Add ca/Vit D supp.   Meds ordered this encounter  Medications  . medroxyPROGESTERone Acetate 150 MG/ML SUSY    Sig: Inject 1 mL (150 mg total) into the muscle  every 3 (three) months.    Dispense:  1 mL    Refill:  3    Order Specific Question:   Supervising Provider    Answer:   Nadara Mustard [[id number]]             GYN counsel adequate intake of calcium and vitamin D, diet and exercise     F/U  Return in about 1 year (around 10/13/2020).  Nori Poland B. Shlok Raz, PA-C 10/14/2019 2:18 PM

## 2019-10-14 NOTE — Patient Instructions (Signed)
I value your feedback and entrusting us with your care. If you get a [place] patient survey, I would appreciate you taking the time to let us know about your experience today. Thank you!  As of April 07, 2019, your lab results will be released to your MyChart immediately, before I even have a chance to see them. Please give me time to review them and contact you if there are any abnormalities. Thank you for your patience.  

## 2019-10-19 ENCOUNTER — Telehealth: Payer: Self-pay | Admitting: Obstetrics and Gynecology

## 2019-10-19 DIAGNOSIS — A749 Chlamydial infection, unspecified: Secondary | ICD-10-CM | POA: Insufficient documentation

## 2019-10-19 LAB — CYTOLOGY - PAP
Chlamydia: POSITIVE — AB
Comment: NEGATIVE
Comment: NORMAL
Diagnosis: NEGATIVE
Neisseria Gonorrhea: NEGATIVE

## 2019-10-19 MED ORDER — AZITHROMYCIN 500 MG PO TABS: 1000.0000 mg | ORAL_TABLET | Freq: Once | ORAL | 0 refills | Status: AC

## 2019-10-19 NOTE — Telephone Encounter (Signed)
LMTRC re: pos chlamydia on pap. Rx azithro eRxd. Partner needs tx. RTO in 4 wks for TOC. RN to notify [facility].

## 2019-10-20 NOTE — Telephone Encounter (Signed)
Pt called back but you were gone already. Put her on for [facility] but she also had some questions. Advised you were on vacation but would be checking messages!

## 2019-10-20 NOTE — Telephone Encounter (Signed)
Called pt to get more info, no answer, LVMTRC. 

## 2019-10-21 NOTE — Telephone Encounter (Signed)
[facility] notified. 

## 2019-10-21 NOTE — Telephone Encounter (Signed)
Pt was returning ABC's call. Aware to reply to ABC's msg via mychart. Only question she asked was if Rx had been sent already. Pt is aware Rx sent two days ago.

## 2019-10-21 NOTE — Telephone Encounter (Signed)
I sent her mychart msg with all the details, too. Ask her to reply to my msg with her questions. If you cant answer them. Thx.

## 2019-10-21 NOTE — Telephone Encounter (Signed)
Patient returning call, same cb.  

## 2019-11-04 ENCOUNTER — Other Ambulatory Visit: Payer: Self-pay

## 2019-11-04 ENCOUNTER — Ambulatory Visit (INDEPENDENT_AMBULATORY_CARE_PROVIDER_SITE_OTHER): Payer: Medicaid Other

## 2019-11-04 DIAGNOSIS — Z3042 Encounter for surveillance of injectable contraceptive: Secondary | ICD-10-CM

## 2019-11-04 MED ORDER — MEDROXYPROGESTERONE ACETATE 150 MG/ML IM SUSP
150.0000 mg | Freq: Once | INTRAMUSCULAR | Status: AC
Start: 2019-11-04 — End: 2019-11-04
  Administered 2019-11-04: 150 mg via INTRAMUSCULAR

## 2019-11-04 NOTE — Progress Notes (Signed)
Pt here for depo which was given IM right glut.  NDC# [address]-2 

## 2019-12-09 ENCOUNTER — Ambulatory Visit: Payer: Medicaid Other | Admitting: Obstetrics and Gynecology

## 2019-12-14 ENCOUNTER — Other Ambulatory Visit: Payer: Self-pay

## 2019-12-14 ENCOUNTER — Other Ambulatory Visit (HOSPITAL_COMMUNITY)
Admission: RE | Admit: 2019-12-14 | Discharge: 2019-12-14 | Disposition: A | Discharge status: Home / Self Care | Payer: Medicaid Other | Source: Ambulatory Visit | Attending: Obstetrics and Gynecology | Admitting: Obstetrics and Gynecology

## 2019-12-14 ENCOUNTER — Encounter: Payer: Self-pay | Admitting: Obstetrics and Gynecology

## 2019-12-14 ENCOUNTER — Ambulatory Visit (INDEPENDENT_AMBULATORY_CARE_PROVIDER_SITE_OTHER): Payer: Medicaid Other | Admitting: Obstetrics and Gynecology

## 2019-12-14 VITALS — BP 106/80 | Ht 60.0 in | Wt 79.0 lb

## 2019-12-14 DIAGNOSIS — A749 Chlamydial infection, unspecified: Secondary | ICD-10-CM | POA: Insufficient documentation

## 2019-12-14 DIAGNOSIS — Z113 Encounter for screening for infections with a predominantly sexual mode of transmission: Secondary | ICD-10-CM | POA: Diagnosis present

## 2019-12-14 DIAGNOSIS — N941 Unspecified dyspareunia: Secondary | ICD-10-CM | POA: Diagnosis not present

## 2019-12-14 NOTE — Progress Notes (Signed)
Patient, No Pcp Per   Chief Complaint  Patient presents with  . Follow-up    TOC, cramps and pain after intercourse    HPI:      [name] is a 19 y.o. G0P0000 whose LMP was No LMP recorded. Patient has had an injection., presents today for chlamydia TOC, diagnosed 6/21 and treated with azithro. Partner did tx. Has been sex active since tx. No vag sx. Is on depo, no BTB/dysmen. Has had some cramping after orgasm for the past 1-2 wks. No pelvic pain otherwise. No GI sx.    Past Medical History:  Diagnosis Date  . Acid reflux   . Anxiety   . Backache   . Depression   . Diarrhea, functional   . Dysmenorrhea   . Eating disorder   . Head ache   . Heart palpitations   . Seasonal allergies   . Vomiting     Past Surgical History:  Procedure Laterality Date  . colonscopy  06/2015  . DENTAL SURGERY      Family History  Problem Relation Age of Onset  . Lung cancer Maternal Grandmother   . Stroke Maternal Grandmother   . Diabetes Maternal Grandfather   . Stroke Maternal Grandfather   . Hypertension Maternal Grandfather   . Cancer Other        LUNG; SKIN CA IN SITU    Social History   Socioeconomic History  . Marital status: Single    Spouse name: Not on file  . Number of children: 0  . Years of education: 57  . Highest education level: Not on file  Occupational History  . Occupation: Consulting civil engineer  Tobacco Use  . Smoking status: Never Smoker  . Smokeless tobacco: Never Used  Vaping Use  . Vaping Use: Never used  Substance and Sexual Activity  . Alcohol use: No  . Drug use: No  . Sexual activity: Yes    Birth control/protection: Injection  Other Topics Concern  . Not on file  Social History Narrative  . Not on file   Social Determinants of Health   Financial Resource Strain:   . Difficulty of Paying Living Expenses:   Food Insecurity:   . Worried About Programme researcher, broadcasting/film/video in the Last Year:   . Barista in the Last Year:     Transportation Needs:   . Freight forwarder (Medical):   Marland Kitchen Lack of Transportation (Non-Medical):   Physical Activity:   . Days of Exercise per Week:   . Minutes of Exercise per Session:   Stress:   . Feeling of Stress :   Social Connections:   . Frequency of Communication with Friends and Family:   . Frequency of Social Gatherings with Friends and Family:   . Attends Religious Services:   . Active Member of Clubs or Organizations:   . Attends Banker Meetings:   Marland Kitchen Marital Status:   Intimate Partner Violence:   . Fear of Current or Ex-Partner:   . Emotionally Abused:   Marland Kitchen Physically Abused:   . Sexually Abused:     Outpatient Medications Prior to Visit  Medication Sig Dispense Refill  . cetirizine (ZYRTEC) 10 MG tablet Take by mouth.    . medroxyPROGESTERone Acetate 150 MG/ML SUSY Inject 1 mL (150 mg total) into the muscle every 3 (three) months. 1 mL 3   No facility-administered medications prior to visit.      ROS:  Review of  Systems  Constitutional: Negative for fever.  Gastrointestinal: Negative for blood in stool, constipation, diarrhea, nausea and vomiting.  Genitourinary: Positive for dyspareunia. Negative for dysuria, flank pain, frequency, hematuria, urgency, vaginal bleeding, vaginal discharge and vaginal pain.  Musculoskeletal: Negative for back pain.  Skin: Negative for rash.  BREAST: No symptoms   OBJECTIVE:   Vitals:  BP 106/80   Ht 5' (1.524 m)   Wt 79 lb (35.8 kg)   BMI 15.43 kg/m   Physical Exam Vitals reviewed.  Constitutional:      Appearance: She is well-developed.  Pulmonary:     Effort: Pulmonary effort is normal.  Genitourinary:    General: Normal vulva.     Pubic Area: No rash.      Labia:        Right: No rash, tenderness or lesion.        Left: No rash, tenderness or lesion.      Vagina: Normal. No vaginal discharge, erythema or tenderness.     Cervix: Normal.     Uterus: Normal. Not enlarged and not tender.       Adnexa: Right adnexa normal and left adnexa normal.       Right: No mass or tenderness.         Left: No mass or tenderness.    Musculoskeletal:        General: Normal range of motion.     Cervical back: Normal range of motion.  Skin:    General: Skin is warm and dry.  Neurological:     General: No focal deficit present.     Mental Status: She is alert and oriented to person, place, and time.  Psychiatric:        Mood and Affect: Mood normal.        Behavior: Behavior normal.        Thought Content: Thought content normal.        Judgment: Judgment normal.     Assessment/Plan: Chlamydia - Plan: Cervicovaginal ancillary only; TOC today. Will f/u with results.   Screening for STD (sexually transmitted disease) - Plan: Cervicovaginal ancillary only  Dyspareunia in female--for 1-2 wks after orgasm. Neg exam. Rule out STDs. If neg, see if sx improve vs progress. F/u prn.     Return if symptoms worsen or fail to improve.  Julio Storr B. Eugen Jeansonne, PA-C 12/14/2019 4:46 PM

## 2019-12-14 NOTE — Patient Instructions (Signed)
I value your feedback and entrusting us with your care. If you get a [place] patient survey, I would appreciate you taking the time to let us know about your experience today. Thank you!  As of April 07, 2019, your lab results will be released to your MyChart immediately, before I even have a chance to see them. Please give me time to review them and contact you if there are any abnormalities. Thank you for your patience.  

## 2019-12-16 LAB — CERVICOVAGINAL ANCILLARY ONLY
Chlamydia: NEGATIVE
Comment: NEGATIVE
Comment: NORMAL
Neisseria Gonorrhea: NEGATIVE

## 2020-01-12 IMAGING — DX DG HAND COMPLETE 3+V*R*
3 series · 3 of 3 positions shown · non-contrast
Comparison: Right wrist radiographs March 24, 2018

CLINICAL DATA: Dog bite

EXAM:
RIGHT HAND - COMPLETE 3+ VIEW

[hand ap]
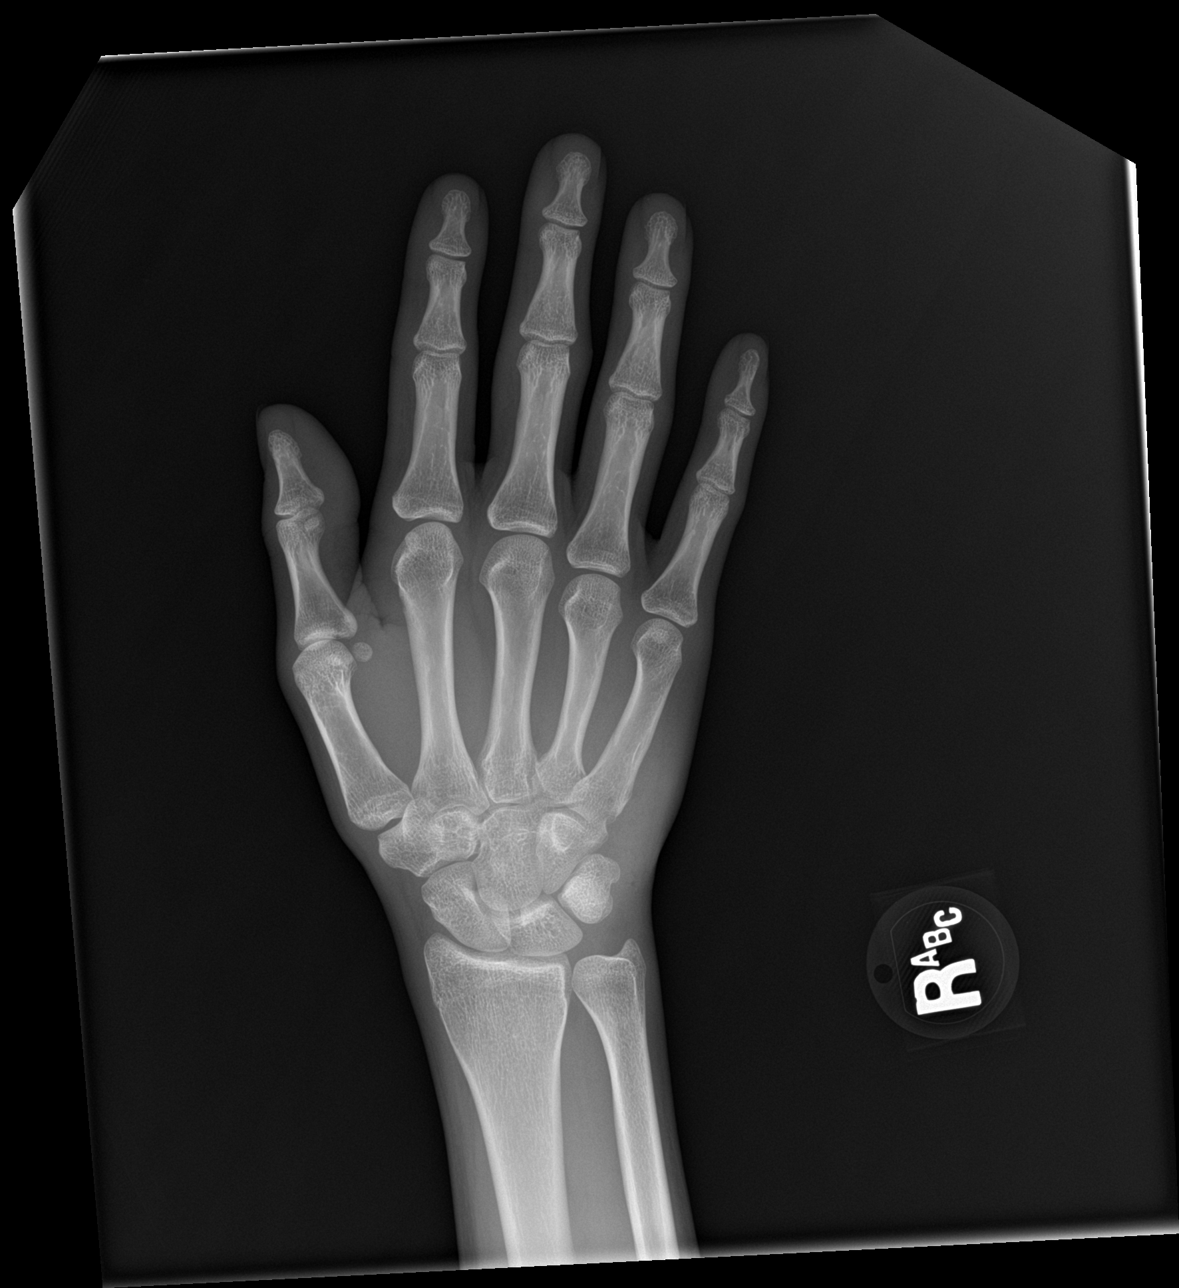

[hand obl]
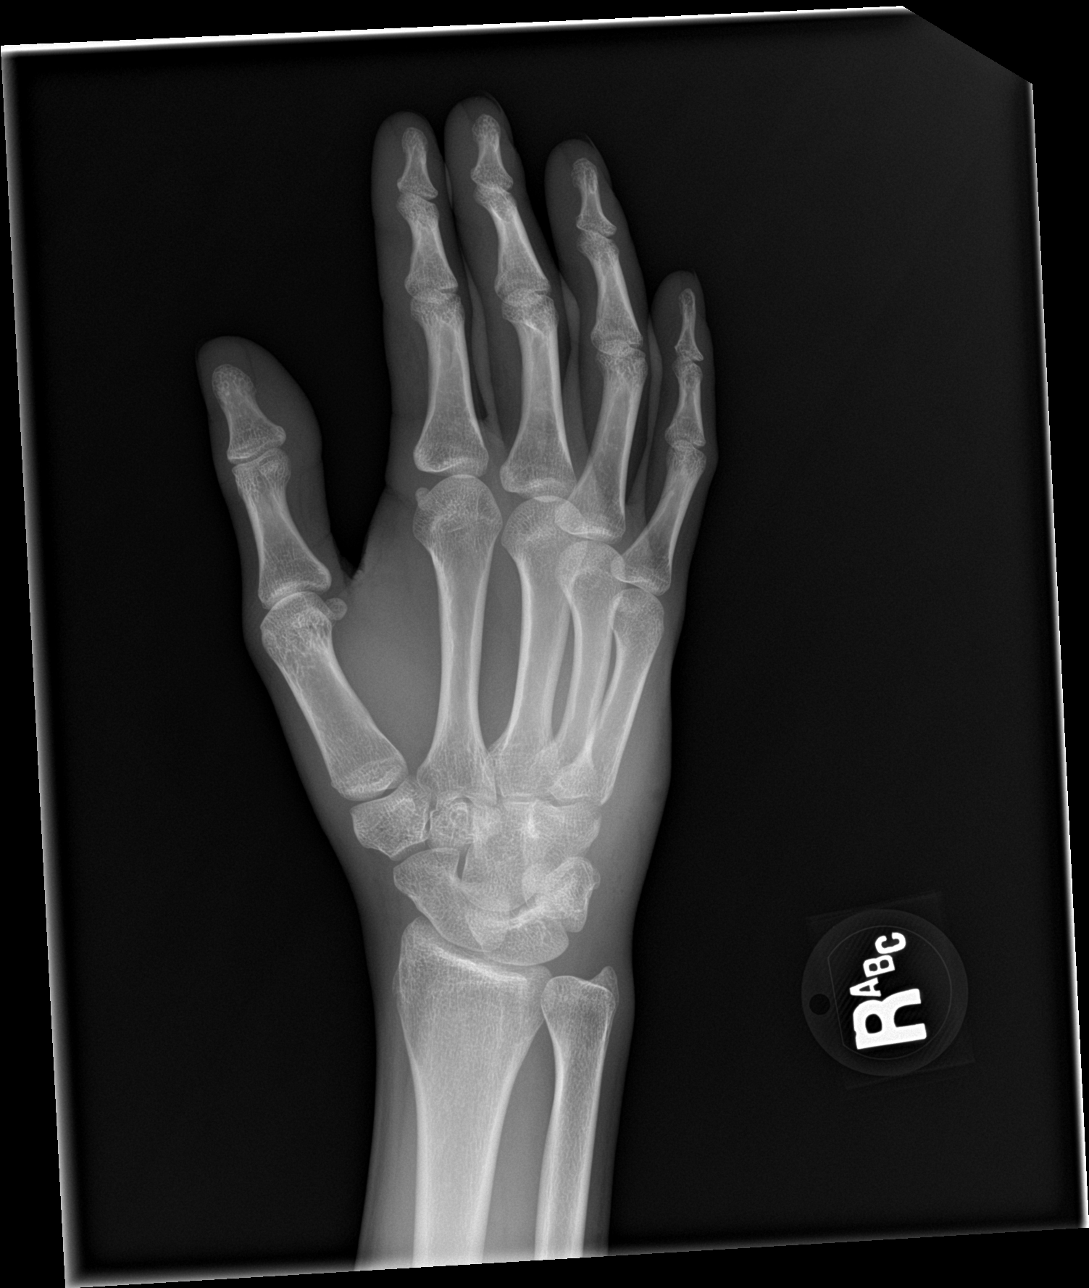

[hand lat]
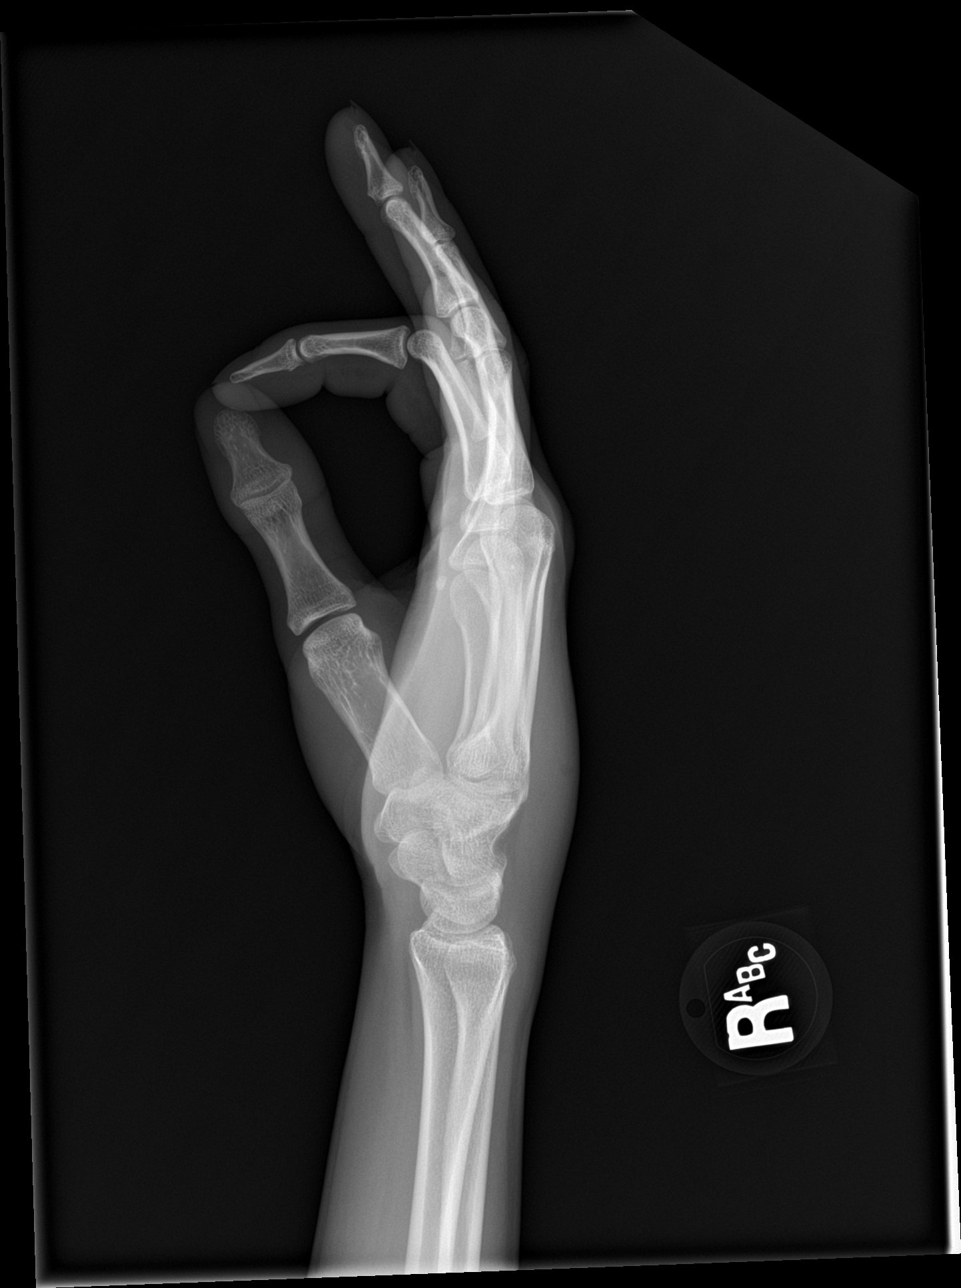

[3 of 3 positions shown; findings below may reference images not displayed]

FINDINGS: Frontal, oblique, and lateral views obtained. Foci of air noted
medial to the distal carpal row. No radiopaque foreign body. There
is soft tissue swelling dorsally and proximally.

No fracture or dislocation. Joint spaces appear normal. No erosive
change or bony destruction.
IMPRESSION: Soft tissue swelling and air dorsally and medially. No radiopaque
foreign body. No fracture or dislocation. No appreciable
arthropathy.

## 2020-01-25 NOTE — Progress Notes (Signed)
Patient, No Pcp Per   Chief Complaint  Patient presents with  . Vaginal Pain    and bleeding after intercourse  . Pelvic Pain    entire pelvic area    HPI:      [name] is a 19 y.o. G0P0000 whose LMP was No LMP recorded. Patient has had an injection., presents today for pelvic pain the past month. Sx are intermittent, sharp, random and sometimes with cramping. Improved with NSAIDs. On depo and amenorrheic. No urin or vag sx. Had constipation about 3 wks ago that resolved, but didn't affect pain. Has some LBP.  Hx of chlamydiea with neg TOC 8/21.  She is sex active, no new partners. Using condoms. Has had cramping and red bleeding after sex recently, lasts about a day. No bleeding/pain during sex. Neg pap 10/14/19   Past Medical History:  Diagnosis Date  . Acid reflux   . Anxiety   . Backache   . Depression   . Diarrhea, functional   . Dysmenorrhea   . Eating disorder   . Head ache   . Heart palpitations   . Seasonal allergies   . Vomiting     Past Surgical History:  Procedure Laterality Date  . colonscopy  06/2015  . DENTAL SURGERY      Family History  Problem Relation Age of Onset  . Lung cancer Maternal Grandmother   . Stroke Maternal Grandmother   . Diabetes Maternal Grandfather   . Stroke Maternal Grandfather   . Hypertension Maternal Grandfather   . Cancer Other        LUNG; SKIN CA IN SITU    Social History   Socioeconomic History  . Marital status: Single    Spouse name: Not on file  . Number of children: 0  . Years of education: 22  . Highest education level: Not on file  Occupational History  . Occupation: Consulting civil engineer  Tobacco Use  . Smoking status: Never Smoker  . Smokeless tobacco: Never Used  Vaping Use  . Vaping Use: Never used  Substance and Sexual Activity  . Alcohol use: No  . Drug use: No  . Sexual activity: Yes    Birth control/protection: Injection  Other Topics Concern  . Not on file  Social History Narrative  .  Not on file   Social Determinants of Health   Financial Resource Strain:   . Difficulty of Paying Living Expenses: Not on file  Food Insecurity:   . Worried About Programme researcher, broadcasting/film/video in the Last Year: Not on file  . Ran Out of Food in the Last Year: Not on file  Transportation Needs:   . Lack of Transportation (Medical): Not on file  . Lack of Transportation (Non-Medical): Not on file  Physical Activity:   . Days of Exercise per Week: Not on file  . Minutes of Exercise per Session: Not on file  Stress:   . Feeling of Stress : Not on file  Social Connections:   . Frequency of Communication with Friends and Family: Not on file  . Frequency of Social Gatherings with Friends and Family: Not on file  . Attends Religious Services: Not on file  . Active Member of Clubs or Organizations: Not on file  . Attends Banker Meetings: Not on file  . Marital Status: Not on file  Intimate Partner Violence:   . Fear of Current or Ex-Partner: Not on file  . Emotionally Abused: Not on file  .  Physically Abused: Not on file  . Sexually Abused: Not on file    Outpatient Medications Prior to Visit  Medication Sig Dispense Refill  . cetirizine (ZYRTEC) 10 MG tablet Take by mouth.    . medroxyPROGESTERone Acetate 150 MG/ML SUSY Inject 1 mL (150 mg total) into the muscle every 3 (three) months. 1 mL 3   No facility-administered medications prior to visit.      ROS:  Review of Systems  Constitutional: Positive for fatigue. Negative for fever.  Gastrointestinal: Negative for blood in stool, constipation, diarrhea, nausea and vomiting.  Genitourinary: Positive for pelvic pain and vaginal bleeding. Negative for dyspareunia, dysuria, flank pain, frequency, hematuria, urgency, vaginal discharge and vaginal pain.  Musculoskeletal: Negative for back pain.  Skin: Negative for rash.    OBJECTIVE:   Vitals:  BP 110/80   Ht 5' (1.524 m)   Wt 77 lb (34.9 kg)   BMI 15.04 kg/m    Physical Exam Vitals reviewed.  Constitutional:      Appearance: She is well-developed.  Pulmonary:     Effort: Pulmonary effort is normal.  Abdominal:     Palpations: Abdomen is soft.     Tenderness: There is no abdominal tenderness. There is no guarding or rebound.  Genitourinary:    General: Normal vulva.     Pubic Area: No rash.      Labia:        Right: No rash, tenderness or lesion.        Left: No rash, tenderness or lesion.      Vagina: Normal. No vaginal discharge, erythema or tenderness.     Cervix: Normal.     Uterus: Normal. Not enlarged and not tender.      Adnexa: Right adnexa normal and left adnexa normal.       Right: No mass or tenderness.         Left: No mass or tenderness.    Musculoskeletal:        General: Normal range of motion.     Cervical back: Normal range of motion.  Skin:    General: Skin is warm and dry.  Neurological:     General: No focal deficit present.     Mental Status: She is alert and oriented to person, place, and time.  Psychiatric:        Mood and Affect: Mood normal.        Behavior: Behavior normal.        Thought Content: Thought content normal.        Judgment: Judgment normal.     Assessment/Plan: Pelvic pain - Plan: Cervicovaginal ancillary only; for 1 mo, neg exam. Rule out STDs. If neg, check GYN u/s. May be side effect of depo. Will f/u with results.  Postcoital bleeding--rule out STDs. If neg, could be uterine lining instability due to depo. Can try ERT for 2 wks.  Screening for STD (sexually transmitted disease) - Plan: Cervicovaginal ancillary only    Return if symptoms worsen or fail to improve.  Varonica Siharath B. Fruma Africa, PA-C 01/26/2020 11:10 AM

## 2020-01-25 NOTE — Patient Instructions (Signed)
I value your feedback and entrusting us with your care. If you get a [place] patient survey, I would appreciate you taking the time to let us know about your experience today. Thank you!  As of April 07, 2019, your lab results will be released to your MyChart immediately, before I even have a chance to see them. Please give me time to review them and contact you if there are any abnormalities. Thank you for your patience.  

## 2020-01-26 ENCOUNTER — Encounter: Payer: Self-pay | Admitting: Obstetrics and Gynecology

## 2020-01-26 ENCOUNTER — Other Ambulatory Visit (HOSPITAL_COMMUNITY)
Admission: RE | Admit: 2020-01-26 | Discharge: 2020-01-26 | Disposition: A | Discharge status: Home / Self Care | Payer: Medicaid Other | Source: Ambulatory Visit | Attending: Obstetrics and Gynecology | Admitting: Obstetrics and Gynecology

## 2020-01-26 ENCOUNTER — Ambulatory Visit (INDEPENDENT_AMBULATORY_CARE_PROVIDER_SITE_OTHER): Payer: Medicaid Other | Admitting: Obstetrics and Gynecology

## 2020-01-26 ENCOUNTER — Other Ambulatory Visit: Payer: Self-pay

## 2020-01-26 VITALS — BP 110/80 | Ht 60.0 in | Wt 77.0 lb

## 2020-01-26 DIAGNOSIS — Z113 Encounter for screening for infections with a predominantly sexual mode of transmission: Secondary | ICD-10-CM | POA: Insufficient documentation

## 2020-01-26 DIAGNOSIS — R102 Pelvic and perineal pain: Secondary | ICD-10-CM

## 2020-01-26 DIAGNOSIS — N930 Postcoital and contact bleeding: Secondary | ICD-10-CM | POA: Diagnosis not present

## 2020-01-27 ENCOUNTER — Ambulatory Visit (INDEPENDENT_AMBULATORY_CARE_PROVIDER_SITE_OTHER): Payer: Medicaid Other

## 2020-01-27 DIAGNOSIS — Z3042 Encounter for surveillance of injectable contraceptive: Secondary | ICD-10-CM

## 2020-01-27 LAB — CERVICOVAGINAL ANCILLARY ONLY
Chlamydia: NEGATIVE
Comment: NEGATIVE
Comment: NORMAL
Neisseria Gonorrhea: NEGATIVE

## 2020-01-27 MED ORDER — MEDROXYPROGESTERONE ACETATE 150 MG/ML IM SUSP
150.0000 mg | Freq: Once | INTRAMUSCULAR | Status: AC
Start: 2020-01-27 — End: 2020-01-27
  Administered 2020-01-27: 15:00:00 150 mg via INTRAMUSCULAR

## 2020-01-27 NOTE — Progress Notes (Signed)
Patient presents today for Depo Provera injection within dates. Given IM LUOQ. Patient tolerated well. 

## 2020-01-30 ENCOUNTER — Encounter: Payer: Self-pay | Admitting: Obstetrics and Gynecology

## 2020-04-16 ENCOUNTER — Other Ambulatory Visit: Payer: Self-pay

## 2020-04-16 ENCOUNTER — Ambulatory Visit (INDEPENDENT_AMBULATORY_CARE_PROVIDER_SITE_OTHER): Payer: Medicaid Other

## 2020-04-16 DIAGNOSIS — Z3042 Encounter for surveillance of injectable contraceptive: Secondary | ICD-10-CM

## 2020-04-16 MED ORDER — MEDROXYPROGESTERONE ACETATE 150 MG/ML IM SUSP
150.0000 mg | Freq: Once | INTRAMUSCULAR | Status: AC
  Administered 2020-04-16: 14:00:00 150 mg via INTRAMUSCULAR

## 2020-07-09 ENCOUNTER — Ambulatory Visit: Payer: Medicaid Other

## 2020-07-10 ENCOUNTER — Ambulatory Visit (INDEPENDENT_AMBULATORY_CARE_PROVIDER_SITE_OTHER): Payer: Medicaid Other

## 2020-07-10 ENCOUNTER — Other Ambulatory Visit: Payer: Self-pay

## 2020-07-10 DIAGNOSIS — Z3042 Encounter for surveillance of injectable contraceptive: Secondary | ICD-10-CM

## 2020-07-10 MED ORDER — MEDROXYPROGESTERONE ACETATE 150 MG/ML IM SUSP
150.0000 mg | Freq: Once | INTRAMUSCULAR | Status: AC
  Administered 2020-07-10: 150 mg via INTRAMUSCULAR

## 2020-07-10 NOTE — Progress Notes (Signed)
Patient presents today for Depo Provera injection within dates. Given IM LUOQ. Patient tolerated well. 

## 2020-07-11 ENCOUNTER — Ambulatory Visit: Payer: Medicaid Other

## 2020-09-28 ENCOUNTER — Other Ambulatory Visit: Payer: Self-pay | Admitting: Obstetrics and Gynecology

## 2020-09-28 DIAGNOSIS — Z3042 Encounter for surveillance of injectable contraceptive: Secondary | ICD-10-CM

## 2020-10-02 ENCOUNTER — Ambulatory Visit: Payer: Medicaid Other

## 2020-10-08 ENCOUNTER — Other Ambulatory Visit: Payer: Self-pay | Admitting: Obstetrics and Gynecology

## 2020-10-08 ENCOUNTER — Ambulatory Visit: Payer: Medicaid Other

## 2020-10-08 DIAGNOSIS — Z3042 Encounter for surveillance of injectable contraceptive: Secondary | ICD-10-CM

## 2020-10-08 NOTE — Telephone Encounter (Signed)
Pt calling for bc refill.  [phone]

## 2020-10-10 ENCOUNTER — Other Ambulatory Visit: Payer: Self-pay | Admitting: Obstetrics and Gynecology

## 2020-10-10 DIAGNOSIS — Z3042 Encounter for surveillance of injectable contraceptive: Secondary | ICD-10-CM

## 2020-10-10 MED ORDER — MEDROXYPROGESTERONE ACETATE 150 MG/ML IM SUSY: 150.0000 mg | PREFILLED_SYRINGE | INTRAMUSCULAR | 0 refills | Status: DC

## 2020-10-10 NOTE — Addendum Note (Signed)
Addended by: Loran Senters D on: 10/10/2020 10:16 AM   Modules accepted: Orders

## 2020-10-10 NOTE — Telephone Encounter (Signed)
Voicemail not set up unable to leave message  

## 2020-10-15 ENCOUNTER — Other Ambulatory Visit: Payer: Self-pay

## 2020-10-15 ENCOUNTER — Ambulatory Visit (INDEPENDENT_AMBULATORY_CARE_PROVIDER_SITE_OTHER): Payer: Medicaid Other

## 2020-10-15 DIAGNOSIS — Z3042 Encounter for surveillance of injectable contraceptive: Secondary | ICD-10-CM | POA: Diagnosis not present

## 2020-10-15 LAB — POCT URINE PREGNANCY: Preg Test, Ur: NEGATIVE

## 2020-10-15 MED ORDER — MEDROXYPROGESTERONE ACETATE 150 MG/ML IM SUSP
150.0000 mg | Freq: Once | INTRAMUSCULAR | Status: AC
  Administered 2020-10-15: 150 mg via INTRAMUSCULAR

## 2020-10-15 NOTE — Progress Notes (Signed)
Pt here for depo which was given IM right glut after neg urine preg test obtained.  NDC# [phone]

## 2020-10-19 ENCOUNTER — Ambulatory Visit: Payer: Medicaid Other | Admitting: Obstetrics

## 2020-12-31 ENCOUNTER — Other Ambulatory Visit: Payer: Self-pay | Admitting: Obstetrics and Gynecology

## 2020-12-31 DIAGNOSIS — Z3042 Encounter for surveillance of injectable contraceptive: Secondary | ICD-10-CM

## 2021-01-07 ENCOUNTER — Ambulatory Visit: Payer: Medicaid Other

## 2021-01-11 ENCOUNTER — Other Ambulatory Visit: Payer: Self-pay

## 2021-01-11 ENCOUNTER — Ambulatory Visit (INDEPENDENT_AMBULATORY_CARE_PROVIDER_SITE_OTHER): Payer: Medicaid Other

## 2021-01-11 DIAGNOSIS — Z3042 Encounter for surveillance of injectable contraceptive: Secondary | ICD-10-CM | POA: Diagnosis not present

## 2021-01-11 MED ORDER — MEDROXYPROGESTERONE ACETATE 150 MG/ML IM SUSP
150.0000 mg | Freq: Once | INTRAMUSCULAR | Status: AC
  Administered 2021-01-11: 150 mg via INTRAMUSCULAR

## 2021-01-11 NOTE — Progress Notes (Signed)
Pt here for depo which was given IM right deltoid (pt is very thin).  Pt tolerated procedure well.  NDC# [phone]

## 2021-03-28 ENCOUNTER — Other Ambulatory Visit: Payer: Self-pay | Admitting: Obstetrics and Gynecology

## 2021-03-28 DIAGNOSIS — Z3042 Encounter for surveillance of injectable contraceptive: Secondary | ICD-10-CM

## 2021-03-28 NOTE — Telephone Encounter (Signed)
Called pt, no answer, could not leave voice msg (not an option). Sent pt mychart msg to call our office.

## 2021-03-28 NOTE — Telephone Encounter (Signed)
Pt needs annual.  I know she has depo shot 04/05/21. Pls call her so she can schedule ASAP. Thx

## 2021-04-05 ENCOUNTER — Ambulatory Visit: Payer: Medicaid Other

## 2021-04-05 ENCOUNTER — Other Ambulatory Visit: Payer: Self-pay | Admitting: Obstetrics and Gynecology

## 2021-04-05 DIAGNOSIS — Z3042 Encounter for surveillance of injectable contraceptive: Secondary | ICD-10-CM

## 2021-04-05 NOTE — Telephone Encounter (Signed)
Pt calling to see if depo refill has been sent in.  [phone]  Pt's mailbox is full.  Sent pt a MyChart message that it was approved earlier this morning.

## 2021-04-08 ENCOUNTER — Ambulatory Visit (INDEPENDENT_AMBULATORY_CARE_PROVIDER_SITE_OTHER): Payer: Medicaid Other | Admitting: Licensed Practical Nurse

## 2021-04-08 ENCOUNTER — Encounter: Payer: Self-pay | Admitting: Licensed Practical Nurse

## 2021-04-08 ENCOUNTER — Other Ambulatory Visit: Payer: Self-pay

## 2021-04-08 VITALS — BP 112/58 | HR 107 | Ht 60.0 in | Wt 82.0 lb

## 2021-04-08 DIAGNOSIS — Z3042 Encounter for surveillance of injectable contraceptive: Secondary | ICD-10-CM | POA: Diagnosis not present

## 2021-04-08 DIAGNOSIS — Z01419 Encounter for gynecological examination (general) (routine) without abnormal findings: Secondary | ICD-10-CM | POA: Diagnosis not present

## 2021-04-08 DIAGNOSIS — Z113 Encounter for screening for infections with a predominantly sexual mode of transmission: Secondary | ICD-10-CM | POA: Diagnosis not present

## 2021-04-08 MED ORDER — MEDROXYPROGESTERONE ACETATE 150 MG/ML IM SUSP
150.0000 mg | Freq: Once | INTRAMUSCULAR | Status: AC
  Administered 2021-04-08: 150 mg via INTRAMUSCULAR

## 2021-04-08 NOTE — Progress Notes (Signed)
Gynecology Annual Exam  PCP: [facility]   Chief Complaint:  Chief Complaint  Patient presents with   Gynecologic Exam    Annual - Depo, no concerns. RM 5    History of Present Illness: Patient is a 20 y.o. G0P0000 presents for annual exam. The patient has no complaints today. Desires Depo today   LMP: No LMP recorded. Patient has had an injection. Menarche:14 Average Interval: NA, not applicable days Duration of flow: 0 days Heavy Menses: no Clots: no Intermenstrual Bleeding: no Postcoital Bleeding: no Dysmenorrhea: no  The patient is sexually active. Female, 1 partner in the last year.  She currently uses Depo-Provera injections for contraception. She denies dyspareunia.  The patient does not perform self breast exams.  There  maybe  notable family history of breast or ovarian cancer in her family. Her mother had a hysterectomy at 38 y/o for an issue related to her ovaries/   The patient wears seatbelts: yes.  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   The patient denies current symptoms of depression.  Hx of depression and anxiety noted in chart, pt states "I am happy" no concerns.   Review of Systems: ROS denies any symptoms   Past Medical History:  Patient Active Problem List   Diagnosis Date Noted   Chlamydia 10/19/2019   Raynaud's disease 10/24/2016   Anxiety 06/16/2016   Chronic migraine 06/16/2016   Weight loss 05/05/2016   Patient underweight 12/17/2015   Chest pain 01/17/2014   Dizziness 01/17/2014   Body mass index, pediatric, less than 5th percentile for age 70/22/2015   Awareness of heartbeats 01/17/2014   Syncope and collapse 01/17/2014   Cephalalgia 09/20/2013     Past Surgical History:  Past Surgical History:  Procedure Laterality Date   colonscopy  06/2015   DENTAL SURGERY      Gynecologic History:  No LMP recorded. Patient has had an injection. Contraception: Depo-Provera injections Last Pap: Results were: negative 20221 no abnormalities   Obstetric History: G0P0000  Family History:  Family History  Problem Relation Age of Onset   Lung cancer Maternal Grandmother    Stroke Maternal Grandmother    Diabetes Maternal Grandfather    Stroke Maternal Grandfather    Hypertension Maternal Grandfather    Cancer Other        LUNG; SKIN CA IN SITU    Social History:  Social History   Socioeconomic History   Marital status: Single    Spouse name: Not on file   Number of children: 0   Years of education: 12   Highest education level: Not on file  Occupational History   Occupation: student  Tobacco Use   Smoking status: Never   Smokeless tobacco: Never  Vaping Use   Vaping Use: Former   Substances: Nicotine  Substance and Sexual Activity   Alcohol use: No   Drug use: No   Sexual activity: Yes    Birth control/protection: Injection  Other Topics Concern   Not on file  Social History Narrative   Not on file   Social Determinants of Health   Financial Resource Strain: Not on file  Food Insecurity: Not on file  Transportation Needs: Not on file  Physical Activity: Not on file  Stress: Not on file  Social Connections: Not on file  Intimate Partner Violence: Not on file    Allergies:  Allergies  Allergen Reactions   Buspirone Other (See Comments) and Itching    agitation  agitation  agitation  agitation  agitation  agitation  agitation      Medications: Prior to Admission medications   Medication Sig Start Date End Date Taking? Authorizing Provider  cetirizine (ZYRTEC) 10 MG tablet Take by mouth. 10/09/10  Yes [provider]  medroxyPROGESTERone Acetate 150 MG/ML SUSY INJECT 1 ML (150 MG TOTAL)  INTO THE MUSCLE EVERY 3 (THREE) MONTHS 04/05/21  Yes Copland, Ilona Sorrel, PA-C    Physical Exam Vitals: Blood pressure (!) 112/58, pulse (!) 107, height 5' (1.524 m), weight 82 lb (37.2 kg).  General: NAD HEENT: normocephalic, anicteric Thyroid: no enlargement, no palpable nodules Pulmonary: No increased work of breathing, CTAB Cardiovascular: RRR, distal pulses 2+ Breast: Breast symmetrical, no tenderness, no palpable nodules or masses, no skin or nipple retraction present, no nipple discharge.  No axillary or supraclavicular lymphadenopathy. Abdomen: NABS, soft, non-tender, non-distended.  Umbilicus without lesions.  No hepatomegaly, splenomegaly or masses palpable. No evidence of hernia  Genitourinary:  External: Normal external female genitalia.  Normal urethral meatus, normal Bartholin's and Skene's glands.    Vagina: Normal vaginal mucosa, no evidence of prolapse.    Cervix: Grossly normal in appearance, no bleeding  Uterus: Non-enlarged, mobile, normal contour.  No CMT  Adnexa: ovaries non-enlarged, no adnexal masses  Rectal: deferred  Lymphatic: no evidence of inguinal lymphadenopathy Extremities: no edema, erythema, or tenderness Neurologic: Grossly intact Psychiatric: mood appropriate, affect full   Assessment: 20 y.o. G0P0000 routine annual exam Screening for sexually transmitted infections Surveillance for Depo-Provera Contraception Plan: Problem List Items Addressed This Visit   None Visit Diagnoses     Screening examination for sexually transmitted disease    -  Primary   Relevant Orders   GC/Chlamydia Probe Amp   HEP, RPR, HIV Panel   Encounter for surveillance of injectable contraceptive       Relevant Medications   medroxyPROGESTERone (DEPO-PROVERA) injection 150 mg       1) 4) Gardasil Series discussed and if applicable offered to patient - Patient believes she has previously completed 3 shot series   2) STI screening  wasoffered and accepted  3)   ASCCP guidelines and rational discussed.  Patient opts for every 3 years screening interval  4) Contraception - the patient is currently using  Depo-Provera injections.  She is happy with her current form of contraception and plans to continue We discussed safe sex practices to reduce her furture risk of STI's.  Desires children in 2-3 years   5) Return in about 12 weeks (around 07/01/2021) for depo. Then yearly for annual exam.    Carie Caddy, CNM  [facility], [facility] 04/08/2021, 2:35 PM

## 2021-04-08 NOTE — Progress Notes (Signed)
Patient presents today for Depo Provera injection within dates. Given IM RUOQ. Patient tolerated well.  NDC# 55374-827-07 Lot# [id number] Exp 12/25/24

## 2021-04-09 LAB — HEP, RPR, HIV PANEL
HIV Screen 4th Generation wRfx: NONREACTIVE
Hepatitis B Surface Ag: NEGATIVE
RPR Ser Ql: NONREACTIVE

## 2021-04-10 LAB — GC/CHLAMYDIA PROBE AMP
Chlamydia trachomatis, NAA: NEGATIVE
Neisseria Gonorrhoeae by PCR: NEGATIVE

## 2021-06-27 ENCOUNTER — Other Ambulatory Visit: Payer: Self-pay | Admitting: Obstetrics and Gynecology

## 2021-06-27 DIAGNOSIS — Z3042 Encounter for surveillance of injectable contraceptive: Secondary | ICD-10-CM

## 2021-06-27 NOTE — Telephone Encounter (Signed)
You saw her last. Thx.

## 2021-09-04 ENCOUNTER — Ambulatory Visit: Payer: Medicaid Other | Admitting: Obstetrics

## 2022-01-30 ENCOUNTER — Other Ambulatory Visit (HOSPITAL_COMMUNITY): Payer: Self-pay | Admitting: Internal Medicine

## 2022-01-30 ENCOUNTER — Other Ambulatory Visit: Payer: Self-pay | Admitting: Internal Medicine

## 2022-01-30 DIAGNOSIS — N939 Abnormal uterine and vaginal bleeding, unspecified: Secondary | ICD-10-CM

## 2022-02-10 ENCOUNTER — Ambulatory Visit
Admission: RE | Admit: 2022-02-10 | Discharge: 2022-02-10 | Disposition: A | Discharge status: Home / Self Care | Payer: Medicaid Other | Source: Ambulatory Visit | Attending: Internal Medicine | Admitting: Internal Medicine

## 2022-02-10 DIAGNOSIS — N939 Abnormal uterine and vaginal bleeding, unspecified: Secondary | ICD-10-CM | POA: Diagnosis present

## 2022-02-25 ENCOUNTER — Other Ambulatory Visit: Payer: Self-pay | Admitting: Internal Medicine

## 2022-02-25 DIAGNOSIS — N939 Abnormal uterine and vaginal bleeding, unspecified: Secondary | ICD-10-CM

## 2022-02-25 DIAGNOSIS — N83292 Other ovarian cyst, left side: Secondary | ICD-10-CM

## 2022-05-26 ENCOUNTER — Ambulatory Visit: Admission: RE | Admit: 2022-05-26 | Payer: Medicaid Other | Source: Ambulatory Visit

## 2022-10-22 ENCOUNTER — Ambulatory Visit: Admission: RE | Admit: 2022-10-22 | Payer: Medicaid Other | Source: Ambulatory Visit

## 2022-10-22 ENCOUNTER — Emergency Department
Admission: EM | Admit: 2022-10-22 | Discharge: 2022-10-22 | Discharge status: Against Medical Advice | Payer: Medicaid Other | Attending: Emergency Medicine | Admitting: Emergency Medicine

## 2022-10-22 ENCOUNTER — Encounter: Payer: Self-pay | Admitting: Emergency Medicine

## 2022-10-22 ENCOUNTER — Other Ambulatory Visit: Payer: Self-pay

## 2022-10-22 DIAGNOSIS — M549 Dorsalgia, unspecified: Secondary | ICD-10-CM | POA: Insufficient documentation

## 2022-10-22 DIAGNOSIS — Y92410 Unspecified street and highway as the place of occurrence of the external cause: Secondary | ICD-10-CM | POA: Diagnosis not present

## 2022-10-22 DIAGNOSIS — S0990XA Unspecified injury of head, initial encounter: Secondary | ICD-10-CM | POA: Insufficient documentation

## 2022-10-22 DIAGNOSIS — M542 Cervicalgia: Secondary | ICD-10-CM | POA: Insufficient documentation

## 2022-10-22 DIAGNOSIS — Z5321 Procedure and treatment not carried out due to patient leaving prior to being seen by health care provider: Secondary | ICD-10-CM | POA: Insufficient documentation

## 2022-10-22 NOTE — ED Notes (Signed)
Pt states the CT is "taking too long" and does not wish to stay. Pt is ambulatory with a steady gait.
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
## 2023-01-02 ENCOUNTER — Encounter: Payer: Self-pay | Admitting: Licensed Practical Nurse

## 2023-01-02 ENCOUNTER — Ambulatory Visit (INDEPENDENT_AMBULATORY_CARE_PROVIDER_SITE_OTHER): Payer: Medicaid Other | Admitting: Licensed Practical Nurse

## 2023-01-02 VITALS — BP 116/61 | HR 74 | Wt 85.8 lb

## 2023-01-02 DIAGNOSIS — Z3201 Encounter for pregnancy test, result positive: Secondary | ICD-10-CM | POA: Diagnosis not present

## 2023-01-02 DIAGNOSIS — N912 Amenorrhea, unspecified: Secondary | ICD-10-CM | POA: Diagnosis not present

## 2023-01-02 DIAGNOSIS — N926 Irregular menstruation, unspecified: Secondary | ICD-10-CM

## 2023-01-02 LAB — POCT URINE PREGNANCY: Preg Test, Ur: POSITIVE — AB

## 2023-01-02 NOTE — Progress Notes (Signed)
Pregnancy Confirmation Visit  SUBJECTIVE Here with Lindsay Bennett  STEFANE GROUNDS is a 22 y.o. female who presents for evaluation of amenorrhea and possible pregnancy. Patient's last menstrual period was 11/27/2022 (exact date). and was Normal. She reports regular periods every month, lasting less than a week, normal flow  Pregnancy is desired. She stopped Depo in December 2023.  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  OBJECTIVE BP 116/61   Pulse 74   Wt 85 lb 12.8 oz (38.9 kg)   LMP 11/27/2022 (Exact Date)   BMI 16.76 kg/m  Body mass index is 16.76 kg/m.  alert, well appearing, and in no distress  Lab Review Urine hCG:  ASSESSMENT Amenorrhea.  Presumed pregnancy at 73w1day with EDD of Estimated Date of Delivery: 09/03/2022  PLAN  Encouraged a well-balanced diet, rest, hydration, prenatal vitamins, and walking for exercise. Counseled to avoid alcohol, tobacco, and recreational drugs and to minimize caffeine intake. Safe medications list given. Discussed non-pharmacologic relief measures for nausea. Oriented to [facility]. Genetic screening options were discussed.  A dating [place] was ordered. Return in 5weeks  for labs, NOB physical, and genetic screening if desired.    Carie Caddy, CNM   [facility]  01/02/23  11:34 AM

## 2023-01-16 ENCOUNTER — Ambulatory Visit (INDEPENDENT_AMBULATORY_CARE_PROVIDER_SITE_OTHER): Payer: Medicaid Other

## 2023-01-16 DIAGNOSIS — O0992 Supervision of high risk pregnancy, unspecified, second trimester: Secondary | ICD-10-CM | POA: Insufficient documentation

## 2023-01-16 DIAGNOSIS — Z3689 Encounter for other specified antenatal screening: Secondary | ICD-10-CM

## 2023-01-16 DIAGNOSIS — Z3480 Encounter for supervision of other normal pregnancy, unspecified trimester: Secondary | ICD-10-CM | POA: Insufficient documentation

## 2023-01-16 DIAGNOSIS — Z3400 Encounter for supervision of normal first pregnancy, unspecified trimester: Secondary | ICD-10-CM | POA: Insufficient documentation

## 2023-01-16 NOTE — Progress Notes (Signed)
New OB Intake  I connected with  Lindsay Bennett on 01/16/23 at  1:15 PM EDT by telephone Video Visit and verified that I am speaking with the correct person using two identifiers. Nurse is located at [facility] and pt is located at home.  I explained I am completing New OB Intake today. We discussed her EDD of 09/03/2023 that is based on LMP of 11/27/2022. Pt is G1/P0. I reviewed her allergies, medications, Medical/Surgical/OB history, and appropriate screenings. There are cats in the home. There is no litter box; mainly an outside cat. Based on history, this is a/an pregnancy uncomplicated .   Patient Active Problem List   Diagnosis Date Noted   Supervision of other normal pregnancy, antepartum 01/16/2023   Raynaud's disease 10/24/2016   Anxiety 06/16/2016   Chronic migraine 06/16/2016   Weight loss 05/05/2016   Patient underweight 12/17/2015   Chest pain 01/17/2014   Dizziness 01/17/2014   Body mass index, pediatric, less than 5th percentile for age 31/22/2015   Awareness of heartbeats 01/17/2014   Syncope and collapse 01/17/2014   Cephalalgia 09/20/2013    Concerns addressed today Nausea; adv per new protocol.  Delivery Plans:  Plans to deliver at [facility] - no; adv [facility] is the only hospital our providers deliver at so if she goes elsewhere she will have someone deliver her that she hasn't seen before.  Anatomy US Explained first scheduled [place] will be Sept 26th and an anatomy scan will be done at 20 weeks.  Labs Discussed genetic screening with patient. Patient desires genetic testing to be drawn at new OB visit. Discussed possible labs to be drawn at new OB appointment.  COVID Vaccine Patient has not had COVID vaccine.   Social Determinants of Health Food Insecurity: denies food insecurity Transportation: Patient denies transportation needs.  First visit review I reviewed new OB appt with pt. I explained she will have ob bloodwork and pap  smear/pelvic exam if indicated. Explained pt will be seen by an AOB Provider at first visit; encounter routed to appropriate provider.   Loran Senters, [place] 01/16/2023  1:42 PM

## 2023-01-16 NOTE — Patient Instructions (Signed)
First Trimester of Pregnancy  The first trimester of pregnancy starts on the first day of your last menstrual period until the end of week 12. This is also called months 1 through 3 of pregnancy. Body changes during your first trimester Your body goes through many changes during pregnancy. The changes usually return to normal after your baby is born. Physical changes You may gain or lose weight. Your breasts may grow larger and hurt. The area around your nipples may get darker. Dark spots or blotches may develop on your face. You may have changes in your hair. Health changes You may feel like you might vomit (nauseous), and you may vomit. You may have heartburn. You may have headaches. You may have trouble pooping (constipation). Your gums may bleed. Other changes You may get tired easily. You may pee (urinate) more often. Your menstrual periods will stop. You may not feel hungry. You may want to eat certain kinds of food. You may have changes in your emotions from day to day. You may have more dreams. Follow these instructions at home: Medicines Take over-the-counter and prescription medicines only as told by your doctor. Some medicines are not safe during pregnancy. Take a prenatal vitamin that contains at least 600 micrograms (mcg) of folic acid. Eating and drinking Eat healthy meals that include: Fresh fruits and vegetables. Whole grains. Good sources of protein, such as meat, eggs, or tofu. Low-fat dairy products. Avoid raw meat and unpasteurized juice, milk, and cheese. If you feel like you may vomit, or you vomit: Eat 4 or 5 small meals a day instead of 3 large meals. Try eating a few soda crackers. Drink liquids between meals instead of during meals. You may need to take these actions to prevent or treat trouble pooping: Drink enough fluids to keep your pee (urine) pale yellow. Eat foods that are high in fiber. These include beans, whole grains, and fresh fruits and  vegetables. Limit foods that are high in fat and sugar. These include fried or sweet foods. Activity Exercise only as told by your doctor. Most people can do their usual exercise routine during pregnancy. Stop exercising if you have cramps or pain in your lower belly (abdomen) or low back. Do not exercise if it is too hot or too humid, or if you are in a place of great height (high altitude). Avoid heavy lifting. If you choose to, you may have sex unless your doctor tells you not to. Relieving pain and discomfort Wear a good support bra if your breasts are sore. Rest with your legs raised (elevated) if you have leg cramps or low back pain. If you have bulging veins (varicose veins) in your legs: Wear support hose as told by your doctor. Raise your feet for 15 minutes, 3-4 times a day. Limit salt in your food. Safety Wear your seat belt at all times when you are in a car. Talk with your doctor if someone is hurting you or yelling at you. Talk with your doctor if you are feeling sad or have thoughts of hurting yourself. Lifestyle Do not use hot tubs, steam rooms, or saunas. Do not douche. Do not use tampons or scented sanitary pads. Do not use herbal medicines, illegal drugs, or medicines that are not approved by your doctor. Do not drink alcohol. Do not smoke or use any products that contain nicotine or tobacco. If you need help quitting, ask your doctor. Avoid cat litter boxes and soil that is used by cats. These carry   germs that can cause harm to the baby and can cause a loss of your baby by miscarriage or stillbirth. General instructions Keep all follow-up visits. This is important. Ask for help if you need counseling or if you need help with nutrition. Your doctor can give you advice or tell you where to go for help. Visit your dentist. At home, brush your teeth with a soft toothbrush. Floss gently. Write down your questions. Take them to your prenatal visits. Where to find more  information American Pregnancy Association: americanpregnancy.org American College of Obstetricians and Gynecologists: www.acog.org Office on Women's Health: womenshealth.gov/pregnancy Contact a doctor if: You are dizzy. You have a fever. You have mild cramps or pressure in your lower belly. You have a nagging pain in your belly area. You continue to feel like you may vomit, you vomit, or you have watery poop (diarrhea) for 24 hours or longer. You have a bad-smelling fluid coming from your vagina. You have pain when you pee. You are exposed to a disease that spreads from person to person, such as chickenpox, measles, Zika virus, HIV, or hepatitis. Get help right away if: You have spotting or bleeding from your vagina. You have very bad belly cramping or pain. You have shortness of breath or chest pain. You have any kind of injury, such as from a fall or a car crash. You have new or increased pain, swelling, or redness in an arm or leg. Summary The first trimester of pregnancy starts on the first day of your last menstrual period until the end of week 12 (months 1 through 3). Eat 4 or 5 small meals a day instead of 3 large meals. Do not smoke or use any products that contain nicotine or tobacco. If you need help quitting, ask your doctor. Keep all follow-up visits. This information is not intended to replace advice given to you by your health care provider. Make sure you discuss any questions you have with your health care provider. Document Revised: 09/21/2019 Document Reviewed: 07/28/2019 Elsevier Patient Education  2024 Elsevier Inc. Commonly Asked Questions During Pregnancy  Cats: A parasite can be excreted in cat feces.  To avoid exposure you need to have another person empty the little box.  If you must empty the litter box you will need to wear gloves.  Wash your hands after handling your cat.  This parasite can also be found in raw or undercooked meat so this should also be  avoided.  Colds, Sore Throats, Flu: Please check your medication sheet to see what you can take for symptoms.  If your symptoms are unrelieved by these medications please call the office.  Dental Work: Most any dental work your dentist recommends is permitted.  X-rays should only be taken during the first trimester if absolutely necessary.  Your abdomen should be shielded with a lead apron during all x-rays.  Please notify your provider prior to receiving any x-rays.  Novocaine is fine; gas is not recommended.  If your dentist requires a note from us prior to dental work please call the office and we will provide one for you.  Exercise: Exercise is an important part of staying healthy during your pregnancy.  You may continue most exercises you were accustomed to prior to pregnancy.  Later in your pregnancy you will most likely notice you have difficulty with activities requiring balance like riding a bicycle.  It is important that you listen to your body and avoid activities that put you at a higher   risk of falling.  Adequate rest and staying well hydrated are a must!  If you have questions about the safety of specific activities ask your provider.    Exposure to Children with illness: Try to avoid obvious exposure; report any symptoms to us when noted,  If you have chicken pos, red measles or mumps, you should be immune to these diseases.   Please do not take any vaccines while pregnant unless you have checked with your OB provider.  Fetal Movement: After 28 weeks we recommend you do "kick counts" twice daily.  Lie or sit down in a calm quiet environment and count your baby movements "kicks".  You should feel your baby at least 10 times per hour.  If you have not felt 10 kicks within the first hour get up, walk around and have something sweet to eat or drink then repeat for an additional hour.  If count remains less than 10 per hour notify your provider.  Fumigating: Follow your pest control agent's  advice as to how long to stay out of your home.  Ventilate the area well before re-entering.  Hemorrhoids:   Most over-the-counter preparations can be used during pregnancy.  Check your medication to see what is safe to use.  It is important to use a stool softener or fiber in your diet and to drink lots of liquids.  If hemorrhoids seem to be getting worse please call the office.   Hot Tubs:  Hot tubs Jacuzzis and saunas are not recommended while pregnant.  These increase your internal body temperature and should be avoided.  Intercourse:  Sexual intercourse is safe during pregnancy as long as you are comfortable, unless otherwise advised by your provider.  Spotting may occur after intercourse; report any bright red bleeding that is heavier than spotting.  Labor:  If you know that you are in labor, please go to the hospital.  If you are unsure, please call the office and let us help you decide what to do.  Lifting, straining, etc:  If your job requires heavy lifting or straining please check with your provider for any limitations.  Generally, you should not lift items heavier than that you can lift simply with your hands and arms (no back muscles)  Painting:  Paint fumes do not harm your pregnancy, but may make you ill and should be avoided if possible.  Latex or water based paints have less odor than oils.  Use adequate ventilation while painting.  Permanents & Hair Color:  Chemicals in hair dyes are not recommended as they cause increase hair dryness which can increase hair loss during pregnancy.  " Highlighting" and permanents are allowed.  Dye may be absorbed differently and permanents may not hold as well during pregnancy.  Sunbathing:  Use a sunscreen, as skin burns easily during pregnancy.  Drink plenty of fluids; avoid over heating.  Tanning Beds:  Because their possible side effects are still unknown, tanning beds are not recommended.  Ultrasound Scans:  Routine ultrasounds are performed  at approximately 20 weeks.  You will be able to see your baby's general anatomy an if you would like to know the gender this can usually be determined as well.  If it is questionable when you conceived you may also receive an ultrasound early in your pregnancy for dating purposes.  Otherwise ultrasound exams are not routinely performed unless there is a medical necessity.  Although you can request a scan we ask that you pay for it when   conducted because insurance does not cover " patient request" scans.  Work: If your pregnancy proceeds without complications you may work until your due date, unless your physician or employer advises otherwise.  Round Ligament Pain/Pelvic Discomfort:  Sharp, shooting pains not associated with bleeding are fairly common, usually occurring in the second trimester of pregnancy.  They tend to be worse when standing up or when you remain standing for long periods of time.  These are the result of pressure of certain pelvic ligaments called "round ligaments".  Rest, Tylenol and heat seem to be the most effective relief.  As the womb and fetus grow, they rise out of the pelvis and the discomfort improves.  Please notify the office if your pain seems different than that described.  It may represent a more serious condition.  Common Medications Safe in Pregnancy  Acne:      Constipation:  Benzoyl Peroxide     Colace  Clindamycin      Dulcolax Suppository  Topica Erythromycin     Fibercon  Salicylic Acid      Metamucil         Miralax AVOID:        Senakot   Accutane    Cough:  Retin-A       Cough Drops  Tetracycline      Phenergan w/ Codeine if Rx  Minocycline      Robitussin (Plain & DM)  Antibiotics:     Crabs/Lice:  Ceclor       RID  Cephalosporins    AVOID:  E-Mycins      Kwell  Keflex  Macrobid/Macrodantin   Diarrhea:  Penicillin      Kao-Pectate  Zithromax      Imodium AD         PUSH FLUIDS AVOID:       Cipro     Fever:  Tetracycline      Tylenol (Regular  or Extra  Minocycline       Strength)  Levaquin      Extra Strength-Do not          Exceed 8 tabs/24 hrs Caffeine:        <200mg/day (equiv. To 1 cup of coffee or  approx. 3 12 oz sodas)         Gas: Cold/Hayfever:       Gas-X  Benadryl      Mylicon  Claritin       Phazyme  **Claritin-D        Chlor-Trimeton    Headaches:  Dimetapp      ASA-Free Excedrin  Drixoral-Non-Drowsy     Cold Compress  Mucinex (Guaifenasin)     Tylenol (Regular or Extra  Sudafed/Sudafed-12 Hour     Strength)  **Sudafed PE Pseudoephedrine   Tylenol Cold & Sinus     Vicks Vapor Rub  Zyrtec  **AVOID if Problems With Blood Pressure         Heartburn: Avoid lying down for at least 1 hour after meals  Aciphex      Maalox     Rash:  Milk of Magnesia     Benadryl    Mylanta       1% Hydrocortisone Cream  Pepcid  Pepcid Complete   Sleep Aids:  Prevacid      Ambien   Prilosec       Benadryl  Rolaids       Chamomile Tea  Tums (Limit 4/day)     Unisom           Tylenol PM         Warm milk-add vanilla or  Hemorrhoids:       Sugar for taste  Anusol/Anusol H.C.  (RX: Analapram 2.5%)  Sugar Substitutes:  Hydrocortisone OTC     Ok in moderation  Preparation H      Tucks        Vaseline lotion applied to tissue with wiping    Herpes:     Throat:  Acyclovir      Oragel  Famvir  Valtrex     Vaccines:         Flu Shot Leg Cramps:       *Gardasil  Benadryl      Hepatitis A         Hepatitis B Nasal Spray:       Pneumovax  Saline Nasal Spray     Polio Booster         Tetanus Nausea:       Tuberculosis test or PPD  Vitamin B6 25 mg TID   AVOID:    Dramamine      *Gardasil  Emetrol       Live Poliovirus  Ginger Root 250 mg QID    MMR (measles, mumps &  High Complex Carbs @ Bedtime    rebella)  Sea Bands-Accupressure    Varicella (Chickenpox)  Unisom 1/2 tab TID     *No known complications           If received before Pain:         Known pregnancy;   Darvocet       Resume series  after  Lortab        Delivery  Percocet    Yeast:   Tramadol      Femstat  Tylenol 3      Gyne-lotrimin  Ultram       Monistat  Vicodin           MISC:         All Sunscreens           Hair Coloring/highlights          Insect Repellant's          (Including DEET)         Mystic Tans  

## 2023-01-22 ENCOUNTER — Other Ambulatory Visit: Payer: Self-pay | Admitting: Licensed Practical Nurse

## 2023-01-22 ENCOUNTER — Ambulatory Visit: Payer: Medicaid Other

## 2023-01-22 ENCOUNTER — Other Ambulatory Visit: Payer: Self-pay

## 2023-01-22 DIAGNOSIS — Z3A08 8 weeks gestation of pregnancy: Secondary | ICD-10-CM | POA: Diagnosis not present

## 2023-01-22 DIAGNOSIS — Z3687 Encounter for antenatal screening for uncertain dates: Secondary | ICD-10-CM | POA: Diagnosis not present

## 2023-01-22 DIAGNOSIS — N926 Irregular menstruation, unspecified: Secondary | ICD-10-CM

## 2023-01-22 DIAGNOSIS — O219 Vomiting of pregnancy, unspecified: Secondary | ICD-10-CM

## 2023-01-22 MED ORDER — DOXYLAMINE-PYRIDOXINE 10-10 MG PO TBEC
2.0000 | DELAYED_RELEASE_TABLET | Freq: Every day | ORAL | 5 refills | Status: DC
Start: 2023-01-22 — End: 2023-08-13

## 2023-01-23 ENCOUNTER — Encounter: Payer: Self-pay | Admitting: Obstetrics

## 2023-01-23 NOTE — Progress Notes (Signed)
Pt came in office on 01/22/23 for ultrasound, she had asked the ultrasound tech if she could have someone refill her zofran. Per Hartley Barefoot we should send in Diclegis for pt in 1st trimester, Diclegis sent in to pts pharmacy.

## 2023-02-19 ENCOUNTER — Other Ambulatory Visit (HOSPITAL_COMMUNITY)
Admission: RE | Admit: 2023-02-19 | Discharge: 2023-02-19 | Disposition: A | Discharge status: Home / Self Care | Payer: Medicaid Other | Source: Ambulatory Visit | Attending: Obstetrics | Admitting: Obstetrics

## 2023-02-19 ENCOUNTER — Ambulatory Visit (INDEPENDENT_AMBULATORY_CARE_PROVIDER_SITE_OTHER): Payer: Medicaid Other | Admitting: Obstetrics

## 2023-02-19 ENCOUNTER — Encounter: Payer: Self-pay | Admitting: Obstetrics

## 2023-02-19 ENCOUNTER — Other Ambulatory Visit: Payer: Self-pay | Admitting: Obstetrics

## 2023-02-19 VITALS — BP 108/58 | HR 88 | Wt 91.0 lb

## 2023-02-19 DIAGNOSIS — Z1379 Encounter for other screening for genetic and chromosomal anomalies: Secondary | ICD-10-CM

## 2023-02-19 DIAGNOSIS — Z3401 Encounter for supervision of normal first pregnancy, first trimester: Secondary | ICD-10-CM | POA: Diagnosis not present

## 2023-02-19 DIAGNOSIS — Z124 Encounter for screening for malignant neoplasm of cervix: Secondary | ICD-10-CM | POA: Insufficient documentation

## 2023-02-19 DIAGNOSIS — Z3A01 Less than 8 weeks gestation of pregnancy: Secondary | ICD-10-CM | POA: Diagnosis not present

## 2023-02-19 DIAGNOSIS — Z23 Encounter for immunization: Secondary | ICD-10-CM

## 2023-02-19 DIAGNOSIS — Z3A12 12 weeks gestation of pregnancy: Secondary | ICD-10-CM | POA: Diagnosis not present

## 2023-02-19 LAB — OB RESULTS CONSOLE VARICELLA ZOSTER ANTIBODY, IGG: Varicella: IMMUNE

## 2023-02-19 NOTE — Progress Notes (Signed)
OBSTETRIC INITIAL PRENATAL VISIT  Subjective:    Lindsay Bennett is being seen today for her first obstetrical visit with partner/FOB Ayden. This was kind of a planned pregnancy, pt DC'ed Depo in 2023 with desire for pregnancy, anticipated period regulation to take longer than it did. She is a 22 y.o. G1P0000 female at [redacted]w[redacted]d gestation, Estimated Date of Delivery: 09/03/23 with Patient's last menstrual period was 11/27/2022 (exact date), consistent with 8 week 3 day sono. Her obstetrical history is significant for  no risk factors .  Relationship with FOB: spouse, living together.  Patient does not intend to breast feed.  Pregnancy history fully reviewed.  OB History  Gravida Para Term Preterm AB Living  1 0 0 0 0 0  SAB IAB Ectopic Multiple Live Births  0 0 0 0 0    # Outcome Date GA Lbr Len/2nd Weight Sex Type Anes PTL Lv  1 Current            Gynecologic History:  Exact LMP: 11/27/2022, periods regular, last 4-5 days, flow is light to mod.  Last pap smear was 10/14/19.  Results were Normal.  Denies h/o abnormals.  STI Hx: Chlamydia in 2021, treated. Contraception prior to conception: Depo, OCPs; discontinued in 2023  Past Medical History:  Diagnosis Date   Acid reflux    Anxiety    Backache    Depression    Diarrhea, functional    Dysmenorrhea    Eating disorder    Head ache    Heart palpitations    Seasonal allergies    Vomiting    Family History  Problem Relation Age of Onset   Sickle cell trait Father    Healthy Sister    Lung cancer Maternal Grandmother 50   Stroke Maternal Grandmother    Diabetes Maternal Grandfather    Stroke Maternal Grandfather    Hypertension Maternal Grandfather    Cancer Other        LUNG; SKIN CA IN SITU   Past Surgical History:  Procedure Laterality Date   colonscopy  06/2015   DENTAL SURGERY     age 71   WISDOM TOOTH EXTRACTION  2019   four;   Social History   Socioeconomic History   Marital status: Single    Spouse name:  Not on file   Number of children: 0   Years of education: 12   Highest education level: Not on file  Occupational History   Occupation: student  Tobacco Use   Smoking status: Never    Passive exposure: Past   Smokeless tobacco: Never  Vaping Use   Vaping status: Former   Substances: Nicotine   Devices: stopped c 1st preg  Substance and Sexual Activity   Alcohol use: No   Drug use: No   Sexual activity: Yes    Partners: Male    Birth control/protection: None  Other Topics Concern   Not on file  Social History Narrative   Not on file   Social Determinants of Health   Financial Resource Strain: Low Risk  (01/16/2023)   Overall Financial Resource Strain (CARDIA)    Difficulty of Paying Living Expenses: Not hard at all  Food Insecurity: No Food Insecurity (01/16/2023)   Hunger Vital Sign    Worried About Running Out of Food in the Last Year: Never true    Ran Out of Food in the Last Year: Never true  Transportation Needs: No Transportation Needs (01/16/2023)   PRAPARE - Transportation  Lack of Transportation (Medical): No    Lack of Transportation (Non-Medical): No  Physical Activity: Insufficiently Active (01/16/2023)   Exercise Vital Sign    Days of Exercise per Week: 2 days    Minutes of Exercise per Session: 30 min  Stress: No Stress Concern Present (01/16/2023)   [employer] of Occupational Health - Occupational Stress Questionnaire    Feeling of Stress : Not at all  Social Connections: Moderately Integrated (01/16/2023)   Social Connection and Isolation Panel [NHANES]    Frequency of Communication with Friends and Family: Twice a week    Frequency of Social Gatherings with Friends and Family: Three times a week    Attends Religious Services: More than 4 times per year    Active Member of Clubs or Organizations: No    Attends Banker Meetings: Never    Marital Status: Living with partner  Intimate Partner Violence: Not At Risk (01/16/2023)    Humiliation, Afraid, Rape, and Kick questionnaire    Fear of Current or Ex-Partner: No    Emotionally Abused: No    Physically Abused: No    Sexually Abused: No   Current Outpatient Medications on File Prior to Visit  Medication Sig Dispense Refill   Doxylamine-Pyridoxine (DICLEGIS) 10-10 MG TBEC Take 2 tablets by mouth at bedtime. If symptoms persist, add one tablet in the morning and one in the afternoon 100 tablet 5   Prenatal Vit-Fe Fumarate-FA (MULTIVITAMIN-PRENATAL) 27-0.8 MG TABS tablet Take 1 tablet by mouth daily at 12 noon.     No current facility-administered medications on file prior to visit.   Allergies  Allergen Reactions   Buspirone Itching and Other (See Comments)    Agitation   Review of Systems General: Not Present- Fever, Weight Loss and Weight Gain. Skin: Not Present- Rash. HEENT: Not Present- Blurred Vision, Headache and Bleeding Gums. Respiratory: Not Present- Difficulty Breathing. Breast: Not Present- Breast Mass. Cardiovascular: Not Present- Chest Pain, Elevated Blood Pressure, Fainting / Blacking Out and Shortness of Breath. Gastrointestinal: Not Present- Abdominal Pain; Present - Constipation, Nausea and Vomiting. Female Genitourinary: Not Present- Frequency, Painful Urination, Pelvic Pain, Vaginal Bleeding, Vaginal Discharge, Contractions, regular, Fetal Movements Decreased, Urinary Complaints and Vaginal Fluid. Musculoskeletal: Not Present- Back Pain and Leg Cramps. Neurological: Not Present- Dizziness. Psychiatric: Not Present- Depression.    Objective:  Blood pressure (!) 108/58, pulse 88, weight 91 lb (41.3 kg), last menstrual period 11/27/2022.  Body mass index is 17.77 kg/m.  General Appearance:    Alert, cooperative, no distress, appears stated age  Head:    Normocephalic, without obvious abnormality, atraumatic  Eyes:    PERRL, conjunctiva/corneas clear, EOM's intact, both eyes  Ears:    Normal external ear canals, both ears  Nose:   Nares  normal, septum midline, mucosa normal, no drainage or sinus tenderness  Throat:   Lips, mucosa, and tongue normal; teeth and gums normal  Neck:   Supple, symmetrical, trachea midline, no adenopathy; thyroid: no enlargement/tenderness/nodules; no carotid bruit or JVD  Back:     Symmetric, no curvature, ROM normal, no CVA tenderness  Lungs:     Clear to auscultation bilaterally, respirations unlabored  Chest Wall:    No tenderness or deformity   Heart:    Regular rate and rhythm, S1 and S2 normal, no murmur, rub or gallop  Breast Exam:    No tenderness, masses, or nipple abnormality  Abdomen:     Soft, non-tender, bowel sounds active all four quadrants, no masses, no  organomegaly.   Genitalia:    Pelvic:external genitalia normal, vagina without lesions, discharge, or tenderness, rectovaginal septum  normal. Cervix normal in appearance, no cervical motion tenderness, no adnexal masses or tenderness.  Pregnancy positive findings: uterine enlargement: 12 wk size, nontender.   Rectal:    Normal external sphincter.  No hemorrhoids appreciated. Internal exam not done.   Extremities:   Extremities normal, atraumatic, no cyanosis or edema  Pulses:   2+ and symmetric all extremities  Skin:   Skin color, texture, turgor normal, no rashes or lesions  Lymph nodes:   Cervical, supraclavicular, and axillary nodes normal  Neurologic:   CNII-XII intact, normal strength, sensation and reflexes throughout     Assessment:   1. Encounter for supervision of normal first pregnancy in first trimester   2. Cervical cancer screening   3. Genetic screening   4. Encounter for immunization    Plan:  22yo G1P0 at [redacted]w[redacted]d by sure LMP = [redacted]w[redacted]d [place] here to establish prenatal care with partner/FOB, Ayden. Pt is doing well and controlling N/V with Doxy/B12 - Initial labs ordered. Pap w/reflex sent.  - Flu shot given today. - Prenatal vitamins encouraged. - Problem list reviewed and updated. - New OB counseling:  The patient  has been given an overview regarding routine prenatal care.  Recommendations regarding diet, weight gain, safe medications, foods to avoid, and exercise in pregnancy were given in pink folder. - Prenatal testing, optional genetic testing, and ultrasound use in pregnancy were reviewed.  Traditional genetic screening vs cell-fee DNA genetic screening discussed, including risks and benefits. Testing ordered and completed today.  - N/V: continue Doxylamine/Pyridoxine prn  Follow up in 4 weeks.   Julieanne Manson, DO [facility]

## 2023-02-20 LAB — HGB FRACTIONATION CASCADE
Hgb A2: 2.9 % (ref 1.8–3.2)
Hgb A: 97.1 % (ref 96.4–98.8)
Hgb F: 0 % (ref 0.0–2.0)
Hgb S: 0 %

## 2023-02-20 LAB — CBC/D/PLT+RPR+RH+ABO+RUBIGG...
Antibody Screen: NEGATIVE
Basophils Absolute: 0 10*3/uL (ref 0.0–0.2)
Basos: 0 %
EOS (ABSOLUTE): 0 10*3/uL (ref 0.0–0.4)
Eos: 0 %
HCV Ab: NONREACTIVE
HIV Screen 4th Generation wRfx: NONREACTIVE
Hematocrit: 33.1 % — ABNORMAL LOW (ref 34.0–46.6)
Hemoglobin: 10.8 g/dL — ABNORMAL LOW (ref 11.1–15.9)
Hepatitis B Surface Ag: NEGATIVE
Immature Grans (Abs): 0 10*3/uL (ref 0.0–0.1)
Immature Granulocytes: 0 %
Lymphocytes Absolute: 1.7 10*3/uL (ref 0.7–3.1)
Lymphs: 17 %
MCH: 29.3 pg (ref 26.6–33.0)
MCHC: 32.6 g/dL (ref 31.5–35.7)
MCV: 90 fL (ref 79–97)
Monocytes Absolute: 0.5 10*3/uL (ref 0.1–0.9)
Monocytes: 5 %
Neutrophils Absolute: 7.6 10*3/uL — ABNORMAL HIGH (ref 1.4–7.0)
Neutrophils: 78 %
Platelets: 304 10*3/uL (ref 150–450)
RBC: 3.68 x10E6/uL — ABNORMAL LOW (ref 3.77–5.28)
RDW: 11.8 % (ref 11.7–15.4)
RPR Ser Ql: NONREACTIVE
Rh Factor: NEGATIVE
Rubella Antibodies, IGG: 1.86 {index} (ref >0.99)
Varicella zoster IgG: REACTIVE
WBC: 9.9 10*3/uL (ref 3.4–10.8)

## 2023-02-20 LAB — HCV INTERPRETATION

## 2023-02-21 LAB — URINE CULTURE, OB REFLEX

## 2023-02-21 LAB — CULTURE, OB URINE

## 2023-02-23 ENCOUNTER — Encounter: Payer: Self-pay | Admitting: Obstetrics

## 2023-02-23 ENCOUNTER — Other Ambulatory Visit: Payer: Self-pay

## 2023-02-23 DIAGNOSIS — R110 Nausea: Secondary | ICD-10-CM

## 2023-02-23 LAB — MONITOR DRUG PROFILE 14(MW)
Amphetamine Scrn, Ur: NEGATIVE ng/mL
BARBITURATE SCREEN URINE: NEGATIVE ng/mL
BENZODIAZEPINE SCREEN, URINE: NEGATIVE ng/mL
Buprenorphine, Urine: NEGATIVE ng/mL
Cocaine (Metab) Scrn, Ur: NEGATIVE ng/mL
Creatinine(Crt), U: 16 mg/dL — ABNORMAL LOW (ref 20.0–300.0)
Fentanyl, Urine: NEGATIVE pg/mL
Meperidine Screen, Urine: NEGATIVE ng/mL
Methadone Screen, Urine: NEGATIVE ng/mL
OXYCODONE+OXYMORPHONE UR QL SCN: NEGATIVE ng/mL
Opiate Scrn, Ur: NEGATIVE ng/mL
Ph of Urine: 7.2 (ref 4.5–8.9)
Phencyclidine Qn, Ur: NEGATIVE ng/mL
Propoxyphene Scrn, Ur: NEGATIVE ng/mL
SPECIFIC GRAVITY: 1.0019
Tramadol Screen, Urine: NEGATIVE ng/mL

## 2023-02-23 LAB — CYTOLOGY - PAP
Chlamydia: NEGATIVE
Comment: NEGATIVE
Comment: NORMAL
Diagnosis: NEGATIVE
Neisseria Gonorrhea: NEGATIVE

## 2023-02-23 LAB — CANNABINOID (GC/MS), URINE
Cannabinoid: POSITIVE — AB
Carboxy THC (GC/MS): 238 ng/mL

## 2023-02-23 MED ORDER — ONDANSETRON 4 MG PO TBDP: 4.0000 mg | ORAL_TABLET | Freq: Four times a day (QID) | ORAL | 0 refills | Status: DC | PRN

## 2023-02-23 NOTE — Telephone Encounter (Signed)
Can send in Zofran for her, and the gummy prenatals tend to be better tolerated, but are over the counter.

## 2023-02-24 LAB — MATERNIT 21 PLUS CORE, BLOOD
Fetal Fraction: 28
Result (T21): NEGATIVE
Trisomy 13 (Patau syndrome): NEGATIVE
Trisomy 18 (Edwards syndrome): NEGATIVE
Trisomy 21 (Down syndrome): NEGATIVE

## 2023-03-19 ENCOUNTER — Other Ambulatory Visit: Payer: Self-pay | Admitting: Obstetrics

## 2023-03-19 ENCOUNTER — Ambulatory Visit: Payer: Medicaid Other | Admitting: Obstetrics

## 2023-03-19 ENCOUNTER — Encounter: Payer: Self-pay | Admitting: Obstetrics

## 2023-03-19 VITALS — BP 117/75 | HR 91 | Wt 85.0 lb

## 2023-03-19 DIAGNOSIS — O219 Vomiting of pregnancy, unspecified: Secondary | ICD-10-CM

## 2023-03-19 DIAGNOSIS — Z3400 Encounter for supervision of normal first pregnancy, unspecified trimester: Secondary | ICD-10-CM

## 2023-03-19 DIAGNOSIS — Z3689 Encounter for other specified antenatal screening: Secondary | ICD-10-CM

## 2023-03-19 DIAGNOSIS — Z3A16 16 weeks gestation of pregnancy: Secondary | ICD-10-CM

## 2023-03-19 DIAGNOSIS — R55 Syncope and collapse: Secondary | ICD-10-CM

## 2023-03-19 DIAGNOSIS — R636 Underweight: Secondary | ICD-10-CM

## 2023-03-19 DIAGNOSIS — R110 Nausea: Secondary | ICD-10-CM

## 2023-03-19 DIAGNOSIS — R634 Abnormal weight loss: Secondary | ICD-10-CM

## 2023-03-19 DIAGNOSIS — O99891 Other specified diseases and conditions complicating pregnancy: Secondary | ICD-10-CM

## 2023-03-19 MED ORDER — PROMETHAZINE HCL 25 MG PO TABS: 25.0000 mg | ORAL_TABLET | Freq: Four times a day (QID) | ORAL | 1 refills | Status: DC | PRN

## 2023-03-19 NOTE — Progress Notes (Signed)
    Return Prenatal Note   Assessment/Plan   Plan  22 y.o. G1P0000 at [redacted]w[redacted]d presents for follow-up OB visit. Reviewed prenatal record including previous visit note.  Patient underweight -Referral sent to nutritionist -Encouraged Boost, protein shakes, nutrient-dense foods   Supervision of normal first pregnancy, antepartum -CBC, B12, vitamin D collected today -Anticipatory guidance about second trimester -Anatomy US ordered  Nausea and vomiting of pregnancy, antepartum -Rx sent for Phenergan. Encouraged hydration, rest, fluids   Orders Placed This Encounter  Procedures   US OB Comp + 14 Wk    Standing Status:   Future    Standing Expiration Date:   03/18/2024    Order Specific Question:   Reason for Exam (SYMPTOM  OR DIAGNOSIS REQUIRED)    Answer:   anatomy    Order Specific Question:   Preferred Imaging Location?    Answer:   Internal   CBC   B12   Vitamin D (25 hydroxy)   Referral to Nutrition and Diabetes Services    Referral Priority:   Routine    Referral Type:   Consultation    Referral Reason:   Specialty Services Required    Number of Visits Requested:   1   No follow-ups on file.   No future appointments.  For next visit:  continue with routine prenatal care     Subjective   Lindsay Bennett is still very nauseated and vomits frequently (she vomited while in exam room). She is not always able to keep down solid foods. Zofran helps a little bit. She is feeling weak and tired. She feels concerned about baby's growth.  Movement: Absent Contractions: Not present  Objective   Flow sheet Vitals: Pulse Rate: 91 BP: 117/75 Fetal Heart Rate (bpm): 145 Total weight gain: 6 lb (2.722 kg)  General Appearance  No acute distress, well appearing, and well nourished Pulmonary   Normal work of breathing Neurologic   Alert and oriented to person, place, and time Psychiatric   Mood and affect within normal limits  Lindsay Bennett, CNM 03/19/23 10:56 AM

## 2023-03-19 NOTE — Assessment & Plan Note (Signed)
-  Referral sent to nutritionist -Encouraged Boost, protein shakes, nutrient-dense foods

## 2023-03-19 NOTE — Assessment & Plan Note (Signed)
-  Rx sent for Phenergan. Encouraged hydration, rest, fluids

## 2023-03-19 NOTE — Assessment & Plan Note (Signed)
-  CBC, B12, vitamin D collected today -Anticipatory guidance about second trimester -Anatomy US ordered

## 2023-03-20 LAB — CBC
Hematocrit: 32.5 % — ABNORMAL LOW (ref 34.0–46.6)
Hemoglobin: 10.6 g/dL — ABNORMAL LOW (ref 11.1–15.9)
MCH: 29.2 pg (ref 26.6–33.0)
MCHC: 32.6 g/dL (ref 31.5–35.7)
MCV: 90 fL (ref 79–97)
Platelets: 328 10*3/uL (ref 150–450)
RBC: 3.63 x10E6/uL — ABNORMAL LOW (ref 3.77–5.28)
RDW: 12.4 % (ref 11.7–15.4)
WBC: 10.1 10*3/uL (ref 3.4–10.8)

## 2023-03-20 LAB — VITAMIN D 25 HYDROXY (VIT D DEFICIENCY, FRACTURES): Vit D, 25-Hydroxy: 65.1 ng/mL (ref 30.0–100.0)

## 2023-03-20 LAB — VITAMIN B12: Vitamin B-12: 262 pg/mL (ref 232–1245)

## 2023-04-08 ENCOUNTER — Encounter: Payer: Self-pay | Admitting: Licensed Practical Nurse

## 2023-04-08 ENCOUNTER — Encounter: Payer: Self-pay | Admitting: Obstetrics

## 2023-04-08 MED ORDER — PROMETHAZINE HCL 25 MG PO TABS: 25.0000 mg | ORAL_TABLET | Freq: Four times a day (QID) | ORAL | 0 refills | Status: DC | PRN

## 2023-04-15 ENCOUNTER — Ambulatory Visit: Payer: Medicaid Other

## 2023-04-15 ENCOUNTER — Encounter: Payer: Self-pay | Admitting: Obstetrics

## 2023-04-15 ENCOUNTER — Ambulatory Visit (INDEPENDENT_AMBULATORY_CARE_PROVIDER_SITE_OTHER): Payer: Medicaid Other | Admitting: Obstetrics

## 2023-04-15 VITALS — BP 104/66 | HR 85 | Wt 92.8 lb

## 2023-04-15 DIAGNOSIS — Z363 Encounter for antenatal screening for malformations: Secondary | ICD-10-CM

## 2023-04-15 DIAGNOSIS — Z3689 Encounter for other specified antenatal screening: Secondary | ICD-10-CM

## 2023-04-15 DIAGNOSIS — Z3A19 19 weeks gestation of pregnancy: Secondary | ICD-10-CM

## 2023-04-15 DIAGNOSIS — Z3400 Encounter for supervision of normal first pregnancy, unspecified trimester: Secondary | ICD-10-CM

## 2023-04-15 LAB — POCT URINALYSIS DIPSTICK OB
Bilirubin, UA: NEGATIVE
Blood, UA: NEGATIVE
Glucose, UA: NEGATIVE
Ketones, UA: NEGATIVE
Leukocytes, UA: NEGATIVE
Nitrite, UA: NEGATIVE
POC,PROTEIN,UA: NEGATIVE
Spec Grav, UA: 1.01 (ref 1.010–1.025)
Urobilinogen, UA: 0.2 U/dL
pH, UA: 7.5 (ref 5.0–8.0)

## 2023-04-15 NOTE — Progress Notes (Signed)
Routine Prenatal Care Visit  Subjective  Lindsay Bennett is a 22 y.o. G1P0000 at [redacted]w[redacted]d being seen today for ongoing prenatal care.  She is currently monitored for the following issues for this low-risk pregnancy and has Cephalalgia; Syncope and collapse; Weight loss; Anxiety; Chronic migraine; Patient underweight; Raynaud's disease; Supervision of normal first pregnancy, antepartum; and Nausea and vomiting of pregnancy, antepartum on their problem list.  ----------------------------------------------------------------------------------- Patient reports nausea and vomiting.  She is doing better on the Phenergan. Also uses THC gummies at night. She has named her [employer]. Here with the FOB who is very supportive. Contractions: Irritability. Vag. Bleeding: None.  Movement: Present. Leaking Fluid denies.  ----------------------------------------------------------------------------------- The following portions of the patient's history were reviewed and updated as appropriate: allergies, current medications, past family history, past medical history, past social history, past surgical history and problem list. Problem list updated.  Objective  Blood pressure 104/66, pulse 85, weight 92 lb 12.8 oz (42.1 kg), last menstrual period 11/27/2022. Pregravid weight 79 lb (35.8 kg) Total Weight Gain 13 lb 12.8 oz (6.26 kg) Urinalysis: Urine Protein Negative  Urine Glucose Negative  Fetal Status: Fetal Heart Rate (bpm): 141   Movement: Present     General:  Alert, oriented and cooperative. Patient is in no acute distress.  Skin: Skin is warm and dry. No rash noted.   Cardiovascular: Normal heart rate noted  Respiratory: Normal respiratory effort, no problems with respiration noted  Abdomen: Soft, gravid, appropriate for gestational age. Pain/Pressure: Absent     Pelvic:  Cervical exam deferred        Extremities: Normal range of motion.  Edema: None  Mental Status: Normal mood and affect. Normal  behavior. Normal judgment and thought content.   Assessment   22 y.o. G1P0000 at [redacted]w[redacted]d by  09/03/2023, by Last Menstrual Period presenting for routine prenatal visit Hyperemesis gravidarum- responding to Phenrgan. THC use Low BMI Incomplete anatomy scan Plan   first Problems (from 01/16/23 to present)     Problem Noted Diagnosed Resolved   Supervision of normal first pregnancy, antepartum 01/16/2023 by Loran Senters, CMA  No   Overview Addendum 04/15/2023  9:32 AM by Mirna Mires, CNM   Clinical Staff Provider  Office Location  [facility] Dating  LMP=[redacted]w[redacted]d [place]  Language  English Anatomy US    Flu Vaccine  02/19/23 Genetic Screen  NIPS: pending  TDaP vaccine   offer Hgb A1C or  GTT Early : Third trimester :   Covid declined   LAB RESULTS   Rhogam  A/Negative/-- (10/24 1026)  Blood Type A/Negative/-- (10/24 1026)   RSV  Antibody Negative (10/24 1026)  Feeding Plan Might breast feed Rubella 1.86 (10/24 1026)  Contraception Depo or pill RPR Non Reactive (10/24 1026)   Circumcision yes HBsAg Negative (10/24 1026)   Pediatrician  [facility] HIV Non Reactive (10/24 1026)  Support Person Ayden Varicella Reactive (10/24 1026)  Prenatal Classes yes GBS  (For PCN allergy, check sensitivities)     Hep C Non Reactive (10/24 1026)   BTL Consent  Pap Diagnosis  Date Value Ref Range Status  02/19/2023   Final   - Negative for intraepithelial lesion or malignancy (NILM)    VBAC Consent  Hgb Electro      CF      SMA                    Preterm labor symptoms and general obstetric precautions including but not limited  to vaginal bleeding, contractions, leaking of fluid and fetal movement were reviewed in detail with the patient. Please refer to After Visit Summary for other counseling recommendations.  We will rescan her in 4 weeks. Addressed the [facility] use, encouraged her to continue with Phenergan. F/U in 4 weeks.  Return in about 4 weeks (around 05/13/2023) for return OB,  repeat anatomy scan .  Mirna Mires, CNM  04/15/2023 9:53 AM

## 2023-04-21 ENCOUNTER — Other Ambulatory Visit: Payer: Self-pay | Admitting: Licensed Practical Nurse

## 2023-04-23 MED ORDER — PROMETHAZINE HCL 25 MG PO TABS: 25.0000 mg | ORAL_TABLET | Freq: Four times a day (QID) | ORAL | 0 refills | Status: DC | PRN

## 2023-04-29 NOTE — L&D Delivery Note (Signed)
 Delivery Note   Lindsay Bennett is a 23 y.o. G1P1001 at [redacted]w[redacted]d Estimated Date of Delivery: 09/03/23  PRE-OPERATIVE DIAGNOSIS:  1) [redacted]w[redacted]d pregnancy.  2) Severe IUGR 3) Marijuana use in pregnancy  POST-OPERATIVE DIAGNOSIS:  1) [redacted]w[redacted]d pregnancy s/p Vaginal, Spontaneous Same, delivered  Delivery Type: Vaginal, Spontaneous   Delivery Anesthesia: Epidural  Labor Complications:  FHR decelerations    ESTIMATED BLOOD LOSS: 150 ml    FINDINGS:   1) female infant, Apgar scores of 8   at 1 minute and 8   at 5 minutes and a birthweight of  5lb13oz/2640g.     SPECIMENS:   PLACENTA:   Appearance: Intact   Removal: Spontaneous;Pathology     Disposition:  Pathology  CORD BLOOD: collected for typing  DISPOSITION:  Infant left in stable condition in the delivery room, with L&D personnel and mother,  NARRATIVE SUMMARY: Labor course:  Lindsay Bennett is a G1P1001 at [redacted]w[redacted]d who presented to Labor & Delivery for induction of labor due to severe IUGR at term, EFW 4%. Her initial cervical exam was FT/70/-2. Labor proceeded after initial dose of misoprostol  25mcg po & 50mcg pv. She received 2 doses of fentanyl  & an epidural for pain management with good relief. After epidural FHR decels noted, resolved with position changes, ephedrine , and single dose of terbutaline . Rapid dilation to 9cm after epidural. She was found to be completely dilated at 1637. With excellent maternal pushing effort, she birthed a viable female infant at 36. There was a single loose nuchal cord, reduced after delivery. The shoulders were birthed without difficulty. The infant was placed skin-to-skin with mother. The cord was doubly clamped and cut by FOB when pulsations ceased. The placenta delivered spontaneously and was noted to be intact with a 3VC. Placenta to pathology due to severe IUGR. A perineal and vaginal examination was performed. Episiotomy/Lacerations: Labial, right Lacerations were repaired with Vicryl suture using  epidural anesthesia. The patient tolerated this well. Mother and baby were left in stable condition.   Forestine Igo, CNM 08/21/2023 7:27 PM

## 2023-05-05 ENCOUNTER — Other Ambulatory Visit: Payer: Self-pay | Admitting: Licensed Practical Nurse

## 2023-05-05 MED ORDER — PROMETHAZINE HCL 25 MG PO TABS: 25.0000 mg | ORAL_TABLET | Freq: Four times a day (QID) | ORAL | 0 refills | Status: DC | PRN

## 2023-05-06 ENCOUNTER — Other Ambulatory Visit: Payer: Self-pay | Admitting: Licensed Practical Nurse

## 2023-05-06 MED ORDER — PROMETHAZINE HCL 25 MG PO TABS: 25.0000 mg | ORAL_TABLET | Freq: Four times a day (QID) | ORAL | 0 refills | Status: DC | PRN

## 2023-05-07 NOTE — Telephone Encounter (Signed)
 Patient inquiring about promethazine  (PHENERGAN ) 25 MG tablet refill request. States she hasn't received notification thru my chart or from pharmacy. Advised promethazine  (PHENERGAN ) 25 MG tablet 30 tablet 0 05/06/2023 --   Sig - Route: Take 1 tablet (25 mg total) by mouth every 6 (six) hours as needed for nausea or vomiting. - Oral   Sent to pharmacy as: promethazine  (PHENERGAN ) 25 MG tablet   E-Prescribing Status: Receipt confirmed by pharmacy (05/06/2023  9:08 AM EST)

## 2023-05-11 ENCOUNTER — Other Ambulatory Visit: Payer: Self-pay | Admitting: Obstetrics

## 2023-05-11 DIAGNOSIS — Z362 Encounter for other antenatal screening follow-up: Secondary | ICD-10-CM

## 2023-05-14 ENCOUNTER — Ambulatory Visit: Payer: Medicaid Other

## 2023-05-14 ENCOUNTER — Ambulatory Visit: Payer: Medicaid Other | Admitting: Obstetrics

## 2023-05-14 VITALS — BP 109/66 | HR 79 | Wt 94.0 lb

## 2023-05-14 DIAGNOSIS — O365920 Maternal care for other known or suspected poor fetal growth, second trimester, not applicable or unspecified: Secondary | ICD-10-CM

## 2023-05-14 DIAGNOSIS — Z3A22 22 weeks gestation of pregnancy: Secondary | ICD-10-CM

## 2023-05-14 DIAGNOSIS — O219 Vomiting of pregnancy, unspecified: Secondary | ICD-10-CM

## 2023-05-14 DIAGNOSIS — Z3A24 24 weeks gestation of pregnancy: Secondary | ICD-10-CM

## 2023-05-14 DIAGNOSIS — Z362 Encounter for other antenatal screening follow-up: Secondary | ICD-10-CM | POA: Diagnosis not present

## 2023-05-14 DIAGNOSIS — O365922 Maternal care for other known or suspected poor fetal growth, second trimester, fetus 2: Secondary | ICD-10-CM

## 2023-05-14 DIAGNOSIS — O0992 Supervision of high risk pregnancy, unspecified, second trimester: Secondary | ICD-10-CM

## 2023-05-14 HISTORY — DX: Maternal care for other known or suspected poor fetal growth, second trimester, not applicable or unspecified: O36.5920

## 2023-05-14 NOTE — Patient Instructions (Signed)
 Glucose Tolerance Test Why am I having this test? The glucose tolerance test (GTT) is done to check how your body processes sugar (glucose). This is one of several tests used to diagnose diabetes (diabetes mellitus). Your health care provider may recommend this test if you: Have a family history of diabetes. Are obese. Have infections that keep coming back. Have had a lot of wounds that did not heal quickly, especially on your legs and feet. Are a woman and have a history of giving birth to very large babies or a history of repeated fetal loss (stillbirth). Have had high glucose levels in your urine or blood: During a past pregnancy. After a heart attack, surgery, or prolonged periods of high stress. What is being tested? This test measures the amount of glucose in your blood at different times during a period of 2 hours. This indicates how well your body is able to process glucose. What kind of sample is taken?  Blood samples are required for this test. They are usually collected by inserting a needle into a blood vessel. How do I prepare for this test? For 3 days before your test, eat normally. Make sure to eat enough carbohydrates (at least 150 grams per day). Follow instructions from your health care provider about: Eating or drinking restrictions on the day of the test. You may be asked to not eat or drink anything other than water (fast) starting 8-12 hours before the test. Changing or stopping your regular medicines. Some medicines may interfere with this test. Tell a health care provider about: All medicines you are taking, including vitamins, herbs, eye drops, creams, and over-the-counter medicines. Any blood disorders you have. Any surgeries you have had. Any medical conditions you have. Whether you are pregnant or may be pregnant. What happens during the test? First, your blood glucose will be measured. This is your fasting blood glucose since you fasted before the test. Then,  you will drink a glucose solution containing a specific amount of glucose. Your blood glucose will be measured again 1 and 2 hours after drinking the solution. This test takes 2 hours to complete. You will need to stay at the testing location during this time. During the testing period: Do not eat or drink anything other than the glucose solution. You will be allowed to drink water. Do not exercise. Do not use any products that contain nicotine or tobacco. These products include cigarettes, chewing tobacco, and vaping devices, such as e-cigarettes. If you need help quitting, ask your health care provider. The testing procedure may vary among health care providers and hospitals. How are the results reported? Your test results will be reported as values. These will be given as milligrams of glucose per deciliter of blood (mg/dL) or millimoles per liter (mmol/L). Your health care provider will compare your results to normal ranges that were established after testing a large group of people (reference ranges). Reference ranges may vary among labs and hospitals. For this test, common reference ranges are: Fasting: less than 110 mg/dL (6.1 mmol/L). 1 hour after drinking glucose: less than 180 mg/dL (21.3 mmol/L). 2 hours after drinking glucose: less than 140 mg/dL (7.8 mmol/L). What do the results mean? Results within the reference ranges are considered normal, meaning your glucose levels are normal. Results higher than the reference ranges may indicate you have diabetes. Talk with your health care provider about what your results mean. Questions to ask your health care provider Ask your health care provider, or the department that  is doing the test: When will my results be ready? How will I get my results? What are my treatment options? What other tests do I need? What are my next steps? Summary The GTT is done to check how your body processes sugar (glucose). This is one of several tests used to  diagnose diabetes. This test measures the amount of glucose in your blood at different times during a period of 2 hours. This indicates how well your body is able to process glucose. Talk with your health care provider about what your results mean. This information is not intended to replace advice given to you by your health care provider. Make sure you discuss any questions you have with your health care provider. Document Revised: 11/19/2021 Document Reviewed: 05/29/2021 Elsevier Patient Education  2024 [employer].

## 2023-05-14 NOTE — Progress Notes (Signed)
    Return Prenatal Note   Subjective  23 y.o. G1P0000 at [redacted]w[redacted]d presents for this follow-up prenatal visit. Pregnancy notable for low BMI and chronic nausea/vomiting.   N/V: Lindsay Bennett reports has been underweight her entire life; starting at 23yo, she saw specialists at [facility] to help her gain weight. She has had problems with keeping food down since a young age. Her mother is also smaller, reports 90lbs. The vomiting worsened with her pregnancy, worst in first trimester and has gradually improved. She is now vomiting about every other day instead of multiple times daily. Still having a hard time finding foods she can keep down, is drinking a lot of chocolate milk and Pedisures. Phenergan helps with the nausea when she does take it. Stopped using THC gummies about 1 month ago, not ingesting any other THC products. Had a follow up anatomy US today and tech informed EFW 7%ile. Lindsay Bennett also c/o back pain she's thinking its sciatica nerve pain.  Lindsay Bennett reports: Movement: Present Contractions: Not present Denies vaginal bleeding or leaking fluid. Objective  Flow sheet Vitals: Pulse Rate: 79 BP: 109/66 Fundal Height: 22 cm Fetal Heart Rate (bpm): 152 Total weight gain: 15 lb (6.804 kg)  General Appearance  No acute distress, well appearing, and well nourished Pulmonary   Normal work of breathing Neurologic   Alert and oriented to person, place, and time Psychiatric   Mood and affect within normal limits  Assessment/Plan   Plan  23 y.o. G1P0000 at [redacted]w[redacted]d by LMP = 8wk [place] presents for follow-up OB visit. Reviewed prenatal record including previous visit note. 1. Supervision of high risk pregnancy in second trimester (Primary) -Discussed 28wk labs, 1hGTT and Tdap for next visit in 4wks; panel ordered  2. Poor fetal growth affecting management of mother in second trimester, single or unspecified fetus -Noted today on follow up anatomy US; EFW 7%ile / AC 23%ile, MVP 2.9, UAD nml.  -No illness  this pregnancy, Lindsay Bennett constitutionally small -MFM consult ordered -Weekly BPP/dopplers, Q3-4wk growth [place]  3. Nausea and vomiting of pregnancy, antepartum -Overall improving, but still continuing every other day; this is chronic for her since 23yo -Discussed ways to supplement her meals, encouraged her to keep trying and finding things she's able to keep down.    Orders Placed This Encounter  Procedures   28 Weeks RH-Panel    Standing Status:   Future    Expected Date:   06/14/2023    Expiration Date:   07/12/2023   Return in about 4 weeks (around 06/11/2023) for rob & 28 week labs & growth [place].   Future Appointments  Date Time Provider Department Center  05/21/2023  9:45 AM [facility] [place] 1 AOB-IMG None  05/28/2023  9:45 AM [facility]OB[facility]OB [place] 1 AOB-IMG None  06/04/2023  2:00 PM [facility]OB-[facility]OB [place] 1 AOB-IM[facility] None  06/12/2023  9:00 AM AOB-OBGYN LAB [facility] None  06/12/2023 10:15 AM [facility] [place] 1 AOB-[facility] None  06/12/2023  1:55 PM Dominic, Courtney Heys, CNM AOB-AOB None   For next visit:  continue with routine prenatal care    Lindsay Manson, DO [facility]

## 2023-05-18 ENCOUNTER — Other Ambulatory Visit: Payer: Self-pay | Admitting: Obstetrics

## 2023-05-18 DIAGNOSIS — O219 Vomiting of pregnancy, unspecified: Secondary | ICD-10-CM

## 2023-05-18 DIAGNOSIS — O365920 Maternal care for other known or suspected poor fetal growth, second trimester, not applicable or unspecified: Secondary | ICD-10-CM

## 2023-05-18 DIAGNOSIS — O0992 Supervision of high risk pregnancy, unspecified, second trimester: Secondary | ICD-10-CM

## 2023-05-21 ENCOUNTER — Ambulatory Visit: Payer: Medicaid Other

## 2023-05-21 DIAGNOSIS — Z3A25 25 weeks gestation of pregnancy: Secondary | ICD-10-CM | POA: Diagnosis not present

## 2023-05-21 DIAGNOSIS — O365920 Maternal care for other known or suspected poor fetal growth, second trimester, not applicable or unspecified: Secondary | ICD-10-CM

## 2023-05-28 ENCOUNTER — Other Ambulatory Visit: Payer: Self-pay | Admitting: Obstetrics

## 2023-05-28 ENCOUNTER — Ambulatory Visit: Payer: Medicaid Other

## 2023-05-28 DIAGNOSIS — O365920 Maternal care for other known or suspected poor fetal growth, second trimester, not applicable or unspecified: Secondary | ICD-10-CM

## 2023-05-28 DIAGNOSIS — Z3A26 26 weeks gestation of pregnancy: Secondary | ICD-10-CM | POA: Diagnosis not present

## 2023-05-28 DIAGNOSIS — F1290 Cannabis use, unspecified, uncomplicated: Secondary | ICD-10-CM | POA: Insufficient documentation

## 2023-06-04 ENCOUNTER — Other Ambulatory Visit: Payer: Medicaid Other

## 2023-06-05 ENCOUNTER — Ambulatory Visit: Payer: Medicaid Other | Attending: Obstetrics and Gynecology | Admitting: Obstetrics and Gynecology

## 2023-06-05 ENCOUNTER — Other Ambulatory Visit: Payer: Self-pay | Admitting: *Deleted

## 2023-06-05 ENCOUNTER — Encounter: Payer: Self-pay | Admitting: *Deleted

## 2023-06-05 ENCOUNTER — Ambulatory Visit: Payer: Medicaid Other | Attending: Obstetrics

## 2023-06-05 ENCOUNTER — Ambulatory Visit: Payer: Medicaid Other | Admitting: *Deleted

## 2023-06-05 VITALS — BP 114/67 | HR 80

## 2023-06-05 DIAGNOSIS — O99322 Drug use complicating pregnancy, second trimester: Secondary | ICD-10-CM | POA: Diagnosis not present

## 2023-06-05 DIAGNOSIS — O365920 Maternal care for other known or suspected poor fetal growth, second trimester, not applicable or unspecified: Secondary | ICD-10-CM | POA: Insufficient documentation

## 2023-06-05 DIAGNOSIS — Z3A27 27 weeks gestation of pregnancy: Secondary | ICD-10-CM

## 2023-06-05 DIAGNOSIS — O0992 Supervision of high risk pregnancy, unspecified, second trimester: Secondary | ICD-10-CM | POA: Insufficient documentation

## 2023-06-05 DIAGNOSIS — O99320 Drug use complicating pregnancy, unspecified trimester: Secondary | ICD-10-CM | POA: Diagnosis present

## 2023-06-05 DIAGNOSIS — F1290 Cannabis use, unspecified, uncomplicated: Secondary | ICD-10-CM

## 2023-06-05 DIAGNOSIS — O365930 Maternal care for other known or suspected poor fetal growth, third trimester, not applicable or unspecified: Secondary | ICD-10-CM

## 2023-06-05 NOTE — Progress Notes (Signed)
  Maternal-Fetal Medicine Consultation I had the pleasure of seeing [name]. She is G1 P0 at 27w 1d gestation and is here for a second opinion ultrasound.  At your office ultrasound performed 3 weeks ago, the estimated fetal weight was at the 7th percentile and fetal growth restriction was suspected.  Her pregnancy is well dated by LMP date consistent with 8-week ultrasound.  Patient has been having intermittent nausea and vomiting extending into her second trimester.  She was using marijuana Gummies and had discontinued now.   She will be screening for gestational diabetes next week. On cell-free fetal DNA screening, the risks of fetal aneuploidies are not increased. Patient's pregravid BMI was 15.4.  Ultrasound We performed fetal anatomical survey.  Amniotic fluid is normal good fetal activity seen.  Fetal growth is appropriate for gestational age.  No markers of aneuploidies or obvious fetal structural defects are seen.  Intracranial structures could not be well-visualized.  Patient understands the limitations of ultrasound in detecting fetal anomalies.  We reassured the patient of normal fetal growth assessment.  Our concerns include: Cannabis (Marijuana) Marijuana is the most-commonly used recreational drug in pregnancy. About 7% to 10% of pregnant women take cannabis. It has been used by some to treat anxiety, depression, nausea and vomiting. A small increase in preterm delivery and neonatal intensive care admissions. No increase in fetal congenital malformations were reported with cannabis. Fetal brain has THC receptor, and long-term effects are not known. Concomitant use of tobacco or other drugs increase the risk of pregnancy adverse outcomes. If cannabis is used frequently, it may lead to persistent vomiting. Antiemetics with proven safety are better to take in pregnancy than cannabis. Some studies reported increase in stillbirth  rates (absolute risk is very small) but that could be because of concomitant use of other recreational drugs or tobacco use. In consistent with ACOG recommendations, cannabis should be avoided in pregnancy.  Maternal Underweight It is defined as BMI less than 18.5 Kg/m2.  Studies have shown that low maternal weight can be associated with increased risks of fetal growth restriction, low birthweight, preterm delivery and slight increase in perinatal mortality. I reassured the patient that most fetuses diagnosed with fetal growth restriction or constitutionally small. Recommendations -An appointment was made for her to return in 5 weeks for fetal growth assessment.   Thank you for consultation.  If you have any questions or concerns, please contact me the [facility].  Consultation including face-to-face (more than 50%) counseling 30 minutes.

## 2023-06-08 ENCOUNTER — Other Ambulatory Visit: Payer: Self-pay | Admitting: Licensed Practical Nurse

## 2023-06-08 MED ORDER — PROMETHAZINE HCL 25 MG PO TABS: 25.0000 mg | ORAL_TABLET | Freq: Four times a day (QID) | ORAL | 0 refills | Status: DC | PRN

## 2023-06-12 ENCOUNTER — Encounter: Payer: Medicaid Other | Admitting: Licensed Practical Nurse

## 2023-06-12 ENCOUNTER — Other Ambulatory Visit: Payer: Medicaid Other

## 2023-06-12 ENCOUNTER — Encounter: Payer: Self-pay | Admitting: Licensed Practical Nurse

## 2023-06-12 NOTE — Telephone Encounter (Signed)
Contacted the patient via phone ans rescheduled.

## 2023-06-16 ENCOUNTER — Encounter: Payer: Self-pay | Admitting: Certified Nurse Midwife

## 2023-06-16 ENCOUNTER — Other Ambulatory Visit: Payer: Medicaid Other

## 2023-06-16 ENCOUNTER — Ambulatory Visit: Payer: Medicaid Other | Admitting: Certified Nurse Midwife

## 2023-06-16 VITALS — BP 122/73 | HR 85 | Wt 96.3 lb

## 2023-06-16 DIAGNOSIS — O0992 Supervision of high risk pregnancy, unspecified, second trimester: Secondary | ICD-10-CM

## 2023-06-16 DIAGNOSIS — R636 Underweight: Secondary | ICD-10-CM

## 2023-06-16 DIAGNOSIS — O365930 Maternal care for other known or suspected poor fetal growth, third trimester, not applicable or unspecified: Secondary | ICD-10-CM | POA: Diagnosis not present

## 2023-06-16 DIAGNOSIS — O26893 Other specified pregnancy related conditions, third trimester: Secondary | ICD-10-CM | POA: Diagnosis not present

## 2023-06-16 DIAGNOSIS — Z3A28 28 weeks gestation of pregnancy: Secondary | ICD-10-CM | POA: Diagnosis not present

## 2023-06-16 DIAGNOSIS — Z23 Encounter for immunization: Secondary | ICD-10-CM | POA: Diagnosis not present

## 2023-06-16 DIAGNOSIS — O360130 Maternal care for anti-D [Rh] antibodies, third trimester, not applicable or unspecified: Secondary | ICD-10-CM

## 2023-06-16 DIAGNOSIS — O0993 Supervision of high risk pregnancy, unspecified, third trimester: Secondary | ICD-10-CM

## 2023-06-16 DIAGNOSIS — O365921 Maternal care for other known or suspected poor fetal growth, second trimester, fetus 1: Secondary | ICD-10-CM

## 2023-06-16 LAB — POCT URINALYSIS DIPSTICK OB
Bilirubin, UA: NEGATIVE
Blood, UA: NEGATIVE
Glucose, UA: NEGATIVE
Ketones, UA: NEGATIVE
Leukocytes, UA: NEGATIVE
Nitrite, UA: NEGATIVE
POC,PROTEIN,UA: NEGATIVE
Spec Grav, UA: <=1.005 — AB (ref 1.010–1.025)
Urobilinogen, UA: 0.2 U/dL
pH, UA: 7 (ref 5.0–8.0)

## 2023-06-16 MED ORDER — RHO D IMMUNE GLOBULIN 1500 UNIT/2ML IJ SOSY
300.0000 ug | PREFILLED_SYRINGE | Freq: Once | INTRAMUSCULAR | Status: AC
  Administered 2023-06-16: 300 ug via INTRAMUSCULAR

## 2023-06-16 NOTE — Patient Instructions (Addendum)
 Birth/Newborn/Lactation at [facility]  [email or web address]   Web-based resources and national groups Birthing from Within: Office manager and doulas DONA International: doulas CAPPA: Office manager and doulas Evidence Based Birth: Office manager   Hypnobabies: childbirth education and doulas International Childbirth Education Association: Therapist, art Association: lactation Technical brewer League International: breastfeeding support Lamaze: Office manager and classes [employer] Doulas Association: doulas  Books to Read:   Pregnancy/Birth: Baby & Me, the essential guide to pregnancy and newborn care Birth Without Fear by January Genella Mech From Within by [employer] CNM Give Birth Like a Feminist by Wilmon Arms The First-Time Parent's Childbirth Handbook by Amie Critchley, CNM, MSN, DNP Leandro Reasoner Guide to Childbirth Mindful Birthing by Westley Chandler CNM The [employer] Spates Guide to Pregnancy and Childbirth Deitra Mayo, You're Pregnant!  By Marella Chimes Pregnancy, childbirth, and the Newborn by Truddie Crumble  Postpartum: Nobody Told Me About That: The First Six Weeks by Norton Blizzard CNM The Fourth Trimester by Dallie Dad  Partners: The Birth Partner by Laure Kidney Pregnant! The First Time Dad's Pregnancy Handbook Be Prepared: A Practical Handbook for New Dads  Breastfeeding: Ina May's Guide to Breastfeeding Feed the Baby by Albertina Parr, IBCLC  (local author!) Lactivate! A user's guide to Breastfeeding by Evelena Peat and Doris Cheadle Latch by Cyndia Skeeters

## 2023-06-16 NOTE — Assessment & Plan Note (Signed)
 06/05/23: MFM consult - growth 35%ile, follow up in 5 weeks for growth - scheduled 3/14

## 2023-06-16 NOTE — Progress Notes (Signed)
    Return Prenatal Note   Assessment/Plan   Plan  23 y.o. G1P0000 at [redacted]w[redacted]d presents for follow-up OB visit. Reviewed prenatal record including previous visit note.  Patient underweight 2# weight gain this past month., 11# overall. Over past week, has been able to finally been able to hold food down through out the day.   Poor fetal growth affecting management of mother in second trimester 06/05/23: MFM consult - growth 35%ile, follow up in 5 weeks for growth - scheduled 3/14   Supervision of high risk pregnancy in second trimester TDAP given.  28 week labs collected Rhogam given, antibody screen collected.  Reviewed labor warning signs and expectations for birth. Instructed to call office or come to hospital with persistent headache, vision changes, regular contractions, leaking of fluid, decreased fetal movement or vaginal bleeding.    Orders Placed This Encounter  Procedures   Tdap vaccine greater than or equal to 7yo IM   Gestational Glucose Tolerance   Hemoglobin and hematocrit, blood   CBC   RPR   POC Urinalysis Dipstick OB   Antibody screen   Return in about 4 weeks (around 07/14/2023).   Future Appointments  Date Time Provider Department Center  07/10/2023 11:15 AM [facility] NURSE [facility]  07/10/2023 11:30 AM [facility] US2 [facility]    For next visit:  continue with routine prenatal care     Subjective   22 y.o. G1P0000 at [redacted]w[redacted]d presents for this follow-up prenatal visit.  Patient is feeling a lot of baby movement. Happy with growth [place] from MFM.  Patient reports: Movement: Present Contractions: Not present  Objective   Flow sheet Vitals: Pulse Rate: 85 BP: 122/73 Fundal Height: 25 cm Fetal Heart Rate (bpm): 145 Total weight gain: 17 lb 4.8 oz (7.847 kg)  General Appearance  No acute distress, well appearing, and well nourished Pulmonary   Normal work of breathing Neurologic   Alert and oriented to person, place, and time Psychiatric   Mood and  affect within normal limits  Oley Balm, CNM  02/18/258:55 AM

## 2023-06-16 NOTE — Assessment & Plan Note (Signed)
 TDAP given.  28 week labs collected Rhogam given, antibody screen collected.  Reviewed labor warning signs and expectations for birth. Instructed to call office or come to hospital with persistent headache, vision changes, regular contractions, leaking of fluid, decreased fetal movement or vaginal bleeding.

## 2023-06-16 NOTE — Assessment & Plan Note (Signed)
 2# weight gain this past month., 11# overall. Over past week, has been able to finally been able to hold food down through out the day.

## 2023-06-17 ENCOUNTER — Other Ambulatory Visit: Payer: Self-pay | Admitting: Obstetrics

## 2023-06-17 ENCOUNTER — Encounter: Payer: Self-pay | Admitting: Obstetrics

## 2023-06-17 DIAGNOSIS — O99019 Anemia complicating pregnancy, unspecified trimester: Secondary | ICD-10-CM

## 2023-06-17 LAB — 28 WEEKS RH-PANEL
Antibody Screen: NEGATIVE
Basophils Absolute: 0 10*3/uL (ref 0.0–0.2)
Basos: 0 %
EOS (ABSOLUTE): 0.1 10*3/uL (ref 0.0–0.4)
Eos: 0 %
Gestational Diabetes Screen: 124 mg/dL (ref 70–139)
HIV Screen 4th Generation wRfx: NONREACTIVE
Hematocrit: 30.6 % — ABNORMAL LOW (ref 34.0–46.6)
Hemoglobin: 10 g/dL — ABNORMAL LOW (ref 11.1–15.9)
Immature Grans (Abs): 0.2 10*3/uL — ABNORMAL HIGH (ref 0.0–0.1)
Immature Granulocytes: 2 %
Lymphocytes Absolute: 2.3 10*3/uL (ref 0.7–3.1)
Lymphs: 15 %
MCH: 29.7 pg (ref 26.6–33.0)
MCHC: 32.7 g/dL (ref 31.5–35.7)
MCV: 91 fL (ref 79–97)
Monocytes Absolute: 0.8 10*3/uL (ref 0.1–0.9)
Monocytes: 6 %
Neutrophils Absolute: 11.4 10*3/uL — ABNORMAL HIGH (ref 1.4–7.0)
Neutrophils: 77 %
Platelets: 301 10*3/uL (ref 150–450)
RBC: 3.37 x10E6/uL — ABNORMAL LOW (ref 3.77–5.28)
RDW: 12.7 % (ref 11.7–15.4)
RPR Ser Ql: NONREACTIVE
WBC: 14.8 10*3/uL — ABNORMAL HIGH (ref 3.4–10.8)

## 2023-06-17 MED ORDER — DOCUSATE SODIUM 100 MG PO CAPS
100.0000 mg | ORAL_CAPSULE | Freq: Every day | ORAL | 0 refills | Status: DC
Start: 2023-06-17 — End: 2023-08-13

## 2023-06-17 MED ORDER — FERROUS SULFATE 325 (65 FE) MG PO TBEC: 325.0000 mg | DELAYED_RELEASE_TABLET | Freq: Every day | ORAL | 0 refills | Status: DC

## 2023-07-08 ENCOUNTER — Other Ambulatory Visit: Payer: Self-pay | Admitting: Licensed Practical Nurse

## 2023-07-08 MED ORDER — PROMETHAZINE HCL 25 MG PO TABS: 25.0000 mg | ORAL_TABLET | Freq: Four times a day (QID) | ORAL | 0 refills | Status: DC | PRN

## 2023-07-10 ENCOUNTER — Ambulatory Visit: Payer: Medicaid Other | Attending: Obstetrics and Gynecology | Admitting: *Deleted

## 2023-07-10 ENCOUNTER — Ambulatory Visit: Payer: Medicaid Other

## 2023-07-10 ENCOUNTER — Other Ambulatory Visit: Payer: Self-pay | Admitting: Obstetrics and Gynecology

## 2023-07-10 ENCOUNTER — Ambulatory Visit: Admitting: Maternal & Fetal Medicine

## 2023-07-10 ENCOUNTER — Ambulatory Visit: Admitting: *Deleted

## 2023-07-10 VITALS — BP 121/65 | HR 66

## 2023-07-10 DIAGNOSIS — Z362 Encounter for other antenatal screening follow-up: Secondary | ICD-10-CM | POA: Insufficient documentation

## 2023-07-10 DIAGNOSIS — O365930 Maternal care for other known or suspected poor fetal growth, third trimester, not applicable or unspecified: Secondary | ICD-10-CM | POA: Diagnosis present

## 2023-07-10 DIAGNOSIS — Z3A32 32 weeks gestation of pregnancy: Secondary | ICD-10-CM | POA: Insufficient documentation

## 2023-07-10 DIAGNOSIS — O99323 Drug use complicating pregnancy, third trimester: Secondary | ICD-10-CM | POA: Insufficient documentation

## 2023-07-10 DIAGNOSIS — O365920 Maternal care for other known or suspected poor fetal growth, second trimester, not applicable or unspecified: Secondary | ICD-10-CM

## 2023-07-10 DIAGNOSIS — O365939 Maternal care for other known or suspected poor fetal growth, third trimester, other fetus: Secondary | ICD-10-CM

## 2023-07-10 DIAGNOSIS — O0992 Supervision of high risk pregnancy, unspecified, second trimester: Secondary | ICD-10-CM

## 2023-07-10 DIAGNOSIS — F1290 Cannabis use, unspecified, uncomplicated: Secondary | ICD-10-CM

## 2023-07-10 NOTE — Procedures (Signed)
 Lindsay Bennett December 17, 2000 [redacted]w[redacted]d  Fetus A Non-Stress Test Interpretation for 07/10/23  BPP with NST  Indication: IUGR  Fetal Heart Rate A Mode: External Baseline Rate (A): 115 bpm Variability: Moderate Accelerations: 15 x 15 Decelerations: None Multiple birth?: No  Uterine Activity Mode: Palpation, Toco Contraction Frequency (min): none Resting Tone Palpated: Relaxed  Interpretation (Fetal Testing) Nonstress Test Interpretation: Reactive Overall Impression: Reassuring for gestational age Comments: Dr. Darra Lis reviewed tracing

## 2023-07-10 NOTE — Progress Notes (Signed)
 Patient information  Patient Name: Lindsay Bennett  Patient MRN:   [id number]  Referring practice: MFM Referring Provider: Salomon Mast  MFM CONSULT  Lindsay Bennett is a 23 y.o. G1P0000 at [redacted]w[redacted]d here for ultrasound and consultation. 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 During today's visit we focused on the following concerns:   Counseling  I discussed the finding of fetal growth restriction (FGR) with the patient today. The ultrasound shows an overall growth at the 4.5th percentile and the abdominal circumference at the 32 percentile. The umbilical artery Dopplers are normal. The BPP was 10/10.  I counseled her about the clinical significance of the Doppler findings and antenatal testing. I discussed the various causes of growth restriction including constitutionally small fetus, placental insufficiency, genetic problems and chronic maternal disease. Currently there is no evidence of sonographic stigmata suggesting infection or aneuploidy.  I discussed the most likely cause of her fetal growth restriction is either a constitutionally small fetus or placental insufficiency. I also discussed the importance of antenatal fetal surveillance including antenatal testing and umbilical artery Doppler assessment to reduce the risk of stillbirth.  I discussed the management going forward in the pregnancy with potential alteration in the timing of delivery. Currently she feels well and denies headache, vision changes, right upper quadrant pain, contractions, vaginal bleeding or loss of  fluid.  She reports good fetal movement.   Sonographic findings Single intrauterine pregnancy. Fetal cardiac activity:  Observed and appears normal. Presentation: Cephalic. Interval fetal anatomy appears normal. Fetal biometry shows the estimated fetal weight at the 4.5 percentile and the abdominal circumference at the 32nd percentile.  Amniotic fluid: Within normal limits.  MVP: 3.98 cm. Placenta: Posterior. Umbilical artery dopplers findings: -S/D:2.5 which are normal at this gestational age.  -Absent end-diastolic flow: No.  -Reversed end-diastolic flow:  No. BPP 10/10.   Assessment/Plan FGR - Continue weekly UA Dopplers and BPPs.  If umbilical artery Dopplers become elevated with periods of absent end-diastolic flow she should have a nonstress test added to allow for twice-weekly antenatal testing. - Continue serial growth ultrasounds every 3 weeks until delivery. - Delivery timing will depend on future antenatal testing and UA dopplers, but likely around 37-38 weeks  Review of Systems: A review of systems was performed and was negative except per HPI   Vitals and Physical Exam    07/10/2023   11:11 AM 06/16/2023    8:25 AM 06/05/2023    8:06 AM  Vitals with BMI  Weight  96 lbs 5 oz   Systolic 121 122 409  Diastolic 65 73 67  Pulse 66 85 80  Sitting comfortably on the sonogram table Nonlabored breathing Normal rate and rhythm Abdomen is nontender  Past pregnancies OB History  Gravida Para Term Preterm AB Living  1 0 0 0 0 0  SAB IAB Ectopic Multiple Live Births  0 0 0 0 0    # Outcome Date GA Lbr Len/2nd Weight Sex Type Anes PTL Lv  1 Current             I spent 30 minutes  reviewing the patients chart, including labs and images as well as counseling the patient about her medical conditions. Greater than 50% of the time was spent in direct face-to-face patient counseling.  Braxton Feathers, DO Maternal fetal medicine, [place]   07/10/2023  12:57 PM

## 2023-07-13 ENCOUNTER — Telehealth: Payer: Self-pay

## 2023-07-13 ENCOUNTER — Other Ambulatory Visit: Payer: Self-pay | Admitting: *Deleted

## 2023-07-13 DIAGNOSIS — O365990 Maternal care for other known or suspected poor fetal growth, unspecified trimester, not applicable or unspecified: Secondary | ICD-10-CM

## 2023-07-13 NOTE — Telephone Encounter (Signed)
 Mar/LM for patient (to advise of 3/20  MFM  appt.

## 2023-07-14 ENCOUNTER — Ambulatory Visit (INDEPENDENT_AMBULATORY_CARE_PROVIDER_SITE_OTHER): Payer: Medicaid Other | Admitting: Certified Nurse Midwife

## 2023-07-14 VITALS — BP 100/61 | HR 80 | Wt 99.0 lb

## 2023-07-14 DIAGNOSIS — Z3A32 32 weeks gestation of pregnancy: Secondary | ICD-10-CM

## 2023-07-14 DIAGNOSIS — O365923 Maternal care for other known or suspected poor fetal growth, second trimester, fetus 3: Secondary | ICD-10-CM | POA: Diagnosis not present

## 2023-07-14 DIAGNOSIS — O365921 Maternal care for other known or suspected poor fetal growth, second trimester, fetus 1: Secondary | ICD-10-CM

## 2023-07-14 DIAGNOSIS — O0993 Supervision of high risk pregnancy, unspecified, third trimester: Secondary | ICD-10-CM

## 2023-07-14 DIAGNOSIS — O0992 Supervision of high risk pregnancy, unspecified, second trimester: Secondary | ICD-10-CM

## 2023-07-14 NOTE — Assessment & Plan Note (Signed)
 Reviewed MFM discussion with patient. She is approprietly  worried about baby but coping well. Reviewed daily kick counts and provided patient education. Reviewed  preterm labor warning signs. Instructed to call office or come to hospital with persistent headache, vision changes, regular contractions, leaking of fluid, decreased fetal movement or vaginal bleeding.

## 2023-07-14 NOTE — Progress Notes (Signed)
    Return Prenatal Note   Assessment/Plan   Plan  23 y.o. G1P0000 at [redacted]w[redacted]d presents for follow-up OB visit. Reviewed prenatal record including previous visit note.  Poor fetal growth affecting management of mother in second trimester MFM findings from 3/14: 4.5%ile, BPP 10/10 - Continue weekly UA Dopplers and BPPs.  If umbilical artery Dopplers become elevated with periods of absent end-diastolic flow she should have a nonstress test added to allow for twice-weekly antenatal testing. - Continue serial growth ultrasounds every 3 weeks until delivery. - Delivery timing will depend on future antenatal testing and UA dopplers, but likely around 37-38 weeks  Supervision of high risk pregnancy in second trimester Reviewed MFM discussion with patient. She is approprietly  worried about baby but coping well. Reviewed daily kick counts and provided patient education. Reviewed  preterm labor warning signs. Instructed to call office or come to hospital with persistent headache, vision changes, regular contractions, leaking of fluid, decreased fetal movement or vaginal bleeding.    No orders of the defined types were placed in this encounter.  No follow-ups on file.   Future Appointments  Date Time Provider Department Center  07/16/2023  7:15 AM [facility] NURSE [facility]  07/16/2023  7:30 AM [facility] US1 [facility]  07/24/2023  1:15 PM [facility] NURSE [facility]  07/24/2023  1:30 PM [facility] US3 [facility]  07/28/2023 10:35 AM Linzie Collin, MD [facility] None  07/30/2023  7:15 AM [facility] NURSE [facility]  07/30/2023  7:30 AM [facility] US4 [facility]    For next visit:   continue with weekly MFM visits and two week ROB with AOB     Subjective   23 y.o. G1P0000 at [redacted]w[redacted]d presents for this follow-up prenatal visit.  Patient reports no concerns today.  Patient reports: Movement: Present Contractions: Not present  Objective   Flow sheet Vitals: Pulse Rate: 80 BP: 100/61 Fundal  Height: 27 cm Fetal Heart Rate (bpm): 150 Total weight gain: 20 lb (9.072 kg)  General Appearance  No acute distress, well appearing, and well nourished Pulmonary   Normal work of breathing Neurologic   Alert and oriented to person, place, and time Psychiatric   Mood and affect within normal limits  Oley Balm, CNM  07/13/2509:14 AM

## 2023-07-14 NOTE — Assessment & Plan Note (Signed)
 MFM findings from 3/14: 4.5%ile, BPP 10/10 - Continue weekly UA Dopplers and BPPs.  If umbilical artery Dopplers become elevated with periods of absent end-diastolic flow she should have a nonstress test added to allow for twice-weekly antenatal testing. - Continue serial growth ultrasounds every 3 weeks until delivery. - Delivery timing will depend on future antenatal testing and UA dopplers, but likely around 37-38 weeks

## 2023-07-16 ENCOUNTER — Other Ambulatory Visit: Payer: Self-pay

## 2023-07-16 ENCOUNTER — Ambulatory Visit: Admitting: *Deleted

## 2023-07-16 ENCOUNTER — Ambulatory Visit: Attending: Maternal & Fetal Medicine

## 2023-07-16 VITALS — BP 104/48 | HR 73

## 2023-07-16 DIAGNOSIS — O0992 Supervision of high risk pregnancy, unspecified, second trimester: Secondary | ICD-10-CM | POA: Insufficient documentation

## 2023-07-16 DIAGNOSIS — Z3A33 33 weeks gestation of pregnancy: Secondary | ICD-10-CM | POA: Diagnosis not present

## 2023-07-16 DIAGNOSIS — O365930 Maternal care for other known or suspected poor fetal growth, third trimester, not applicable or unspecified: Secondary | ICD-10-CM

## 2023-07-16 DIAGNOSIS — O365990 Maternal care for other known or suspected poor fetal growth, unspecified trimester, not applicable or unspecified: Secondary | ICD-10-CM | POA: Insufficient documentation

## 2023-07-24 ENCOUNTER — Other Ambulatory Visit: Payer: Self-pay | Admitting: *Deleted

## 2023-07-24 ENCOUNTER — Ambulatory Visit: Admitting: *Deleted

## 2023-07-24 ENCOUNTER — Ambulatory Visit: Attending: Maternal & Fetal Medicine

## 2023-07-24 VITALS — BP 117/59 | HR 94

## 2023-07-24 DIAGNOSIS — O365990 Maternal care for other known or suspected poor fetal growth, unspecified trimester, not applicable or unspecified: Secondary | ICD-10-CM | POA: Diagnosis present

## 2023-07-24 DIAGNOSIS — O365930 Maternal care for other known or suspected poor fetal growth, third trimester, not applicable or unspecified: Secondary | ICD-10-CM

## 2023-07-24 DIAGNOSIS — O0992 Supervision of high risk pregnancy, unspecified, second trimester: Secondary | ICD-10-CM

## 2023-07-24 DIAGNOSIS — Z3A34 34 weeks gestation of pregnancy: Secondary | ICD-10-CM

## 2023-07-28 ENCOUNTER — Encounter: Payer: Self-pay | Admitting: Obstetrics and Gynecology

## 2023-07-28 ENCOUNTER — Ambulatory Visit (INDEPENDENT_AMBULATORY_CARE_PROVIDER_SITE_OTHER): Admitting: Obstetrics and Gynecology

## 2023-07-28 VITALS — BP 105/68 | HR 66 | Wt 101.1 lb

## 2023-07-28 DIAGNOSIS — Z3A34 34 weeks gestation of pregnancy: Secondary | ICD-10-CM

## 2023-07-28 DIAGNOSIS — O0993 Supervision of high risk pregnancy, unspecified, third trimester: Secondary | ICD-10-CM

## 2023-07-28 NOTE — Progress Notes (Signed)
 ROB:  EGA = 34.5.  Being closely followed by MFM for fetal growth less than the 5th percentile.  Dopplers and BPP good last week.  Scheduled for Dopplers later this week.  Delivery timing per MFM. She reports daily fetal movement.  Has Braxton Hicks contractions only.  We have discussed possibility of induction.  Methods of induction also discussed.

## 2023-07-28 NOTE — Progress Notes (Signed)
 ROB. Patient states daily fetal movement along with braxton hicks. Follow-up MFM ultrasound scheduled for 07/30/23 to discuss delivery timing. She states no questions or concerns at this time.

## 2023-07-30 ENCOUNTER — Other Ambulatory Visit: Payer: Self-pay | Admitting: *Deleted

## 2023-07-30 ENCOUNTER — Ambulatory Visit: Attending: Maternal & Fetal Medicine

## 2023-07-30 ENCOUNTER — Ambulatory Visit

## 2023-07-30 ENCOUNTER — Ambulatory Visit: Admitting: Obstetrics

## 2023-07-30 VITALS — BP 128/73 | HR 62

## 2023-07-30 DIAGNOSIS — F1290 Cannabis use, unspecified, uncomplicated: Secondary | ICD-10-CM | POA: Diagnosis not present

## 2023-07-30 DIAGNOSIS — O0992 Supervision of high risk pregnancy, unspecified, second trimester: Secondary | ICD-10-CM

## 2023-07-30 DIAGNOSIS — O365920 Maternal care for other known or suspected poor fetal growth, second trimester, not applicable or unspecified: Secondary | ICD-10-CM

## 2023-07-30 DIAGNOSIS — Z3A35 35 weeks gestation of pregnancy: Secondary | ICD-10-CM | POA: Insufficient documentation

## 2023-07-30 DIAGNOSIS — O99323 Drug use complicating pregnancy, third trimester: Secondary | ICD-10-CM

## 2023-07-30 DIAGNOSIS — O365990 Maternal care for other known or suspected poor fetal growth, unspecified trimester, not applicable or unspecified: Secondary | ICD-10-CM | POA: Diagnosis present

## 2023-07-30 DIAGNOSIS — O0993 Supervision of high risk pregnancy, unspecified, third trimester: Secondary | ICD-10-CM | POA: Insufficient documentation

## 2023-07-30 DIAGNOSIS — O365930 Maternal care for other known or suspected poor fetal growth, third trimester, not applicable or unspecified: Secondary | ICD-10-CM

## 2023-07-30 NOTE — Progress Notes (Signed)
 MFM Consult Note  Presley Gora is currently at 35 weeks and 0 days.  She has been followed due to IUGR.    She denies any problems since her last exam and reports feeling fetal movements throughout the day.    On today's exam, the EFW of 4 pounds 8 ounces measures at the 5th percentile for her gestational age indicating IUGR.  The fetus has grown over 1 pound over the past 3 weeks.    The total AFI was 14.14 cm (within normal limits).  A BPP performed today was 8 out of 8.    Doppler studies of the umbilical arteries showed a normal S/D ratio of 2.53 .  There were no signs of absent or reversed end-diastolic flow.    The patient was reassured that IUGR is a common finding.  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  Most cases of IUGR result in the delivery of a healthy infant at or close to term.  Due to IUGR, we will continue to follow her with weekly fetal testing and umbilical artery Doppler studies.    We will reassess the fetal growth again in 3 weeks.    Should IUGR continue to be noted at her next growth scan in 3 weeks, delivery will be recommended at that time.  She will return in 1 week for another BPP and umbilical artery Doppler study.    The patient stated that all of her questions were answered today.  A total of 20 minutes was spent counseling and coordinating the care for this patient.  Greater than 50% of the time was spent in direct face-to-face contact.

## 2023-08-05 ENCOUNTER — Other Ambulatory Visit (HOSPITAL_COMMUNITY)
Admission: RE | Admit: 2023-08-05 | Discharge: 2023-08-05 | Disposition: A | Discharge status: Home / Self Care | Source: Ambulatory Visit | Attending: Certified Nurse Midwife | Admitting: Certified Nurse Midwife

## 2023-08-05 ENCOUNTER — Ambulatory Visit (INDEPENDENT_AMBULATORY_CARE_PROVIDER_SITE_OTHER): Admitting: Certified Nurse Midwife

## 2023-08-05 VITALS — BP 112/74 | HR 68 | Wt 101.8 lb

## 2023-08-05 DIAGNOSIS — Z3A35 35 weeks gestation of pregnancy: Secondary | ICD-10-CM

## 2023-08-05 DIAGNOSIS — Z3685 Encounter for antenatal screening for Streptococcus B: Secondary | ICD-10-CM | POA: Diagnosis not present

## 2023-08-05 DIAGNOSIS — Z113 Encounter for screening for infections with a predominantly sexual mode of transmission: Secondary | ICD-10-CM | POA: Diagnosis not present

## 2023-08-05 DIAGNOSIS — O0993 Supervision of high risk pregnancy, unspecified, third trimester: Secondary | ICD-10-CM

## 2023-08-05 NOTE — Progress Notes (Signed)
 ROB doing well. IUGR 5th5 last u/s on 4/3. She continues to have weekly testing with MFM ( appointment this Friday). She has growth scheduled 08/20/23 at which time recommendations for delivery will be given.  She is feeling good movement. She denies any concerns today. GBS and gc/chlamydia self swabs collected. Follow up 1 week or prn.    Doreene Burke, CNM

## 2023-08-06 LAB — CERVICOVAGINAL ANCILLARY ONLY
Chlamydia: NEGATIVE
Comment: NEGATIVE
Comment: NORMAL
Neisseria Gonorrhea: NEGATIVE

## 2023-08-07 ENCOUNTER — Ambulatory Visit: Admitting: Obstetrics

## 2023-08-07 ENCOUNTER — Ambulatory Visit: Attending: Obstetrics and Gynecology

## 2023-08-07 VITALS — BP 119/71

## 2023-08-07 DIAGNOSIS — O0992 Supervision of high risk pregnancy, unspecified, second trimester: Secondary | ICD-10-CM | POA: Diagnosis present

## 2023-08-07 DIAGNOSIS — Z3A36 36 weeks gestation of pregnancy: Secondary | ICD-10-CM | POA: Insufficient documentation

## 2023-08-07 DIAGNOSIS — O365990 Maternal care for other known or suspected poor fetal growth, unspecified trimester, not applicable or unspecified: Secondary | ICD-10-CM | POA: Diagnosis present

## 2023-08-07 DIAGNOSIS — F1290 Cannabis use, unspecified, uncomplicated: Secondary | ICD-10-CM | POA: Diagnosis not present

## 2023-08-07 DIAGNOSIS — O99323 Drug use complicating pregnancy, third trimester: Secondary | ICD-10-CM

## 2023-08-07 DIAGNOSIS — O365930 Maternal care for other known or suspected poor fetal growth, third trimester, not applicable or unspecified: Secondary | ICD-10-CM

## 2023-08-07 LAB — STREP GP B NAA: Strep Gp B NAA: NEGATIVE

## 2023-08-07 NOTE — Progress Notes (Signed)
 MFM Consult Note  Lindsay Bennett is currently at 36 weeks and 1 day.  She has been followed due to IUGR.  She denies any problems since her last exam and reports feeling vigorous fetal movements throughout the day.  A BPP performed today was 8/8.  The total AFI was 14.42 cm (within normal limits).  Doppler studies of the umbilical arteries performed due to fetal growth restriction showed a normal S/D ratio of 2.08.  There were no signs of absent or reversed end-diastolic flow noted today.  She will return in 1 week for another BPP and umbilical artery Doppler study.     We will reassess the fetal growth in 2 weeks.   Should IUGR (EFW <10%) continue to be noted at her next growth scan in 2 weeks, delivery will be recommended at that time.    The patient stated that all of her questions were answered today.  A total of 10 minutes was spent counseling and coordinating the care for this patient.  Greater than 50% of the time was spent in direct face-to-face contact.

## 2023-08-10 ENCOUNTER — Other Ambulatory Visit: Payer: Self-pay | Admitting: *Deleted

## 2023-08-10 DIAGNOSIS — O365930 Maternal care for other known or suspected poor fetal growth, third trimester, not applicable or unspecified: Secondary | ICD-10-CM

## 2023-08-12 ENCOUNTER — Encounter: Admitting: Obstetrics and Gynecology

## 2023-08-12 DIAGNOSIS — Z113 Encounter for screening for infections with a predominantly sexual mode of transmission: Secondary | ICD-10-CM

## 2023-08-12 DIAGNOSIS — Z3A36 36 weeks gestation of pregnancy: Secondary | ICD-10-CM

## 2023-08-12 DIAGNOSIS — O0993 Supervision of high risk pregnancy, unspecified, third trimester: Secondary | ICD-10-CM

## 2023-08-12 DIAGNOSIS — Z3685 Encounter for antenatal screening for Streptococcus B: Secondary | ICD-10-CM

## 2023-08-13 ENCOUNTER — Ambulatory Visit: Attending: Obstetrics

## 2023-08-13 ENCOUNTER — Ambulatory Visit (HOSPITAL_BASED_OUTPATIENT_CLINIC_OR_DEPARTMENT_OTHER): Admitting: Obstetrics and Gynecology

## 2023-08-13 ENCOUNTER — Ambulatory Visit: Admitting: *Deleted

## 2023-08-13 ENCOUNTER — Other Ambulatory Visit: Payer: Self-pay | Admitting: Obstetrics

## 2023-08-13 VITALS — BP 112/66

## 2023-08-13 DIAGNOSIS — O365920 Maternal care for other known or suspected poor fetal growth, second trimester, not applicable or unspecified: Secondary | ICD-10-CM

## 2023-08-13 DIAGNOSIS — Z3A37 37 weeks gestation of pregnancy: Secondary | ICD-10-CM

## 2023-08-13 DIAGNOSIS — O0992 Supervision of high risk pregnancy, unspecified, second trimester: Secondary | ICD-10-CM | POA: Insufficient documentation

## 2023-08-13 DIAGNOSIS — F1290 Cannabis use, unspecified, uncomplicated: Secondary | ICD-10-CM | POA: Diagnosis not present

## 2023-08-13 DIAGNOSIS — O99323 Drug use complicating pregnancy, third trimester: Secondary | ICD-10-CM

## 2023-08-13 DIAGNOSIS — O365930 Maternal care for other known or suspected poor fetal growth, third trimester, not applicable or unspecified: Secondary | ICD-10-CM

## 2023-08-13 NOTE — Progress Notes (Addendum)
 Maternal-Fetal Medicine Consultation Name: Lindsay Bennett MRN: [id number]  G1 P0 at 37 weeks. Fetal growth restriction on ultrasound performed 2 weeks ago, the estimated fetal weight was at the 5th percentile and the abdominal circumference measurement at the 26th percentile.  On today's ultrasound, amniotic fluid normal fetal activity seen.  Cephalic presentation.  Umbilical artery Doppler showed normal forward diastolic flow.Antenatal testing reassuring.  NST is reactive.  BPP 10/10. NST was performed because fetal heart rate ranged between 107 and 117 bpm.  I discussed timing of delivery. Patient desires delivery at 39 weeks or later.  I counseled her that fetal growth restriction is associated with increased risk of perinatal mortality morbidity.  In severe fetal growth restriction, we recommend delivery at [redacted] weeks gestation.  If fetal growth restriction is not severe, we recommended delivery at 38 or [redacted] weeks gestation.  We would recommend delivery after fetal growth assessment next week.  Recommendations - Fetal growth assessment next week and discuss timing at delivery.  Consultation including face-to-face (more than 50%) counseling 10 minutes.

## 2023-08-13 NOTE — Procedures (Signed)
 Lindsay Bennett 11-24-2000 [redacted]w[redacted]d  Fetus A Non-Stress Test Interpretation for 08/13/23  Indication: IUGR  Fetal Heart Rate A Mode: External Baseline Rate (A): 120 bpm Variability: Moderate Accelerations: 15 x 15 Decelerations: None Multiple birth?: No  Uterine Activity Mode: Toco Contraction Frequency (min): Occasional Contraction Duration (sec): 40-60 Resting Tone Palpated: Relaxed  Interpretation (Fetal Testing) Nonstress Test Interpretation: Reactive Comments: Tracing reviewed by Dr. Arnie Bibber

## 2023-08-18 ENCOUNTER — Ambulatory Visit (INDEPENDENT_AMBULATORY_CARE_PROVIDER_SITE_OTHER): Admitting: Obstetrics

## 2023-08-18 ENCOUNTER — Encounter: Payer: Self-pay | Admitting: Obstetrics

## 2023-08-18 VITALS — BP 113/76 | HR 94 | Wt 105.0 lb

## 2023-08-18 DIAGNOSIS — F1290 Cannabis use, unspecified, uncomplicated: Secondary | ICD-10-CM

## 2023-08-18 DIAGNOSIS — O99323 Drug use complicating pregnancy, third trimester: Secondary | ICD-10-CM

## 2023-08-18 DIAGNOSIS — O99320 Drug use complicating pregnancy, unspecified trimester: Secondary | ICD-10-CM

## 2023-08-18 DIAGNOSIS — O365920 Maternal care for other known or suspected poor fetal growth, second trimester, not applicable or unspecified: Secondary | ICD-10-CM

## 2023-08-18 DIAGNOSIS — Z3A37 37 weeks gestation of pregnancy: Secondary | ICD-10-CM | POA: Diagnosis not present

## 2023-08-18 DIAGNOSIS — O365930 Maternal care for other known or suspected poor fetal growth, third trimester, not applicable or unspecified: Secondary | ICD-10-CM

## 2023-08-18 DIAGNOSIS — O0993 Supervision of high risk pregnancy, unspecified, third trimester: Secondary | ICD-10-CM

## 2023-08-18 NOTE — Patient Instructions (Signed)
 Signs and Symptoms of Labor Labor is the body's natural process of moving the baby and the placenta out of the uterus. The process of labor usually starts when the baby is full-term, between 74 and 41 weeks of pregnancy. Signs and symptoms that you are close to going into labor As your body prepares for labor and the birth of your baby, you may notice the following symptoms in the weeks and days before true labor starts: Passing a small amount of thick, bloody mucus from your vagina. This is called normal bloody show or losing your mucus plug. This may happen more than a week before labor begins, or right before labor begins, as the opening of the cervix starts to widen (dilate). For some women, the entire mucus plug passes at once. For others, pieces of the mucus plug may gradually pass over several days. Your baby moving (dropping) lower in your pelvis to get into position for birth (lightening). When this happens, you may feel more pressure on your bladder and pelvic bone and less pressure on your ribs. This may make it easier to breathe. It may also cause you to need to urinate more often and have problems with bowel movements. Having "practice contractions," also called Braxton Hicks contractions or false labor. These occur at irregular (unevenly spaced) intervals that are more than 10 minutes apart. False labor contractions are common after exercise or sexual activity. They will stop if you change position, rest, or drink fluids. These contractions are usually mild and do not get stronger over time. They may feel like: A backache or back pain. Mild cramps, similar to menstrual cramps. Tightening or pressure in your abdomen. Other early symptoms include: Nausea or loss of appetite. Diarrhea. Having a sudden burst of energy, or feeling very tired. Mood changes. Having trouble sleeping. Signs and symptoms that labor has begun Signs that you are in labor may include: Having contractions that come  at regular (evenly spaced) intervals and increase in intensity. This may feel like more intense tightening or pressure in your abdomen that moves to your back. Contractions may also feel like rhythmic pain in your upper thighs or back that comes and goes at regular intervals. If you are delivering for the first time, this change in intensity of contractions often occurs at a more gradual pace. If you have given birth before, you may notice a more rapid progression of contraction changes. Feeling pressure in the vaginal area. Your water breaking (rupture of membranes). This is when the sac of fluid that surrounds your baby breaks. Fluid leaking from your vagina may be clear or blood-tinged. Labor usually starts within 24 hours of your water breaking, but it may take longer to begin. Some people may feel a sudden gush of fluid; others may notice repeatedly damp underwear. Follow these instructions at home:  When labor starts, or if your water breaks, call your health care provider or nurse care line. Based on your situation, they will determine when you should go in for an exam. During early labor, you may be able to rest and manage symptoms at home. Some strategies to try at home include: Breathing and relaxation techniques. Taking a warm bath or shower. Listening to music. Using a heating pad on the lower back for pain. If directed, apply heat to the area as often as told by your health care provider. Use the heat source that your health care provider recommends, such as a moist heat pack or a heating pad. Place a  towel between your skin and the heat source. Leave the heat on for 20-30 minutes. Remove the heat if your skin turns bright red. This is especially important if you are unable to feel pain, heat, or cold. You have a greater risk of getting burned. Contact a health care provider if: Your labor has started. Your water breaks. You have nausea, vomiting, or diarrhea. Get help right away  if: You have painful, regular contractions that are 5 minutes apart or less. Labor starts before you are [redacted] weeks along in your pregnancy. You have a fever. You have bright red blood coming from your vagina. You do not feel your baby moving. You have a severe headache with or without vision problems. You have chest pain or shortness of breath. These symptoms may represent a serious problem that is an emergency. Do not wait to see if the symptoms will go away. Get medical help right away. Call your local emergency services (911 in the [place] Do not drive yourself to the hospital. Summary Labor is your body's natural process of moving your baby and the placenta out of your uterus. The process of labor usually starts when your baby is full-term, between 25 and 40 weeks of pregnancy. When labor starts, or if your water breaks, call your health care provider or nurse care line. Based on your situation, they will determine when you should go in for an exam. This information is not intended to replace advice given to you by your health care provider. Make sure you discuss any questions you have with your health care provider. Document Revised: 08/28/2020 Document Reviewed: 08/28/2020 Elsevier Patient Education  2024 [employer].

## 2023-08-18 NOTE — Progress Notes (Signed)
    Return Prenatal Note   Subjective  23 y.o. G1P0000 at [redacted]w[redacted]d presents for this follow-up prenatal visit. Pregnancy notable for severe FGR, Rh negative, and THC use during pregnancy, cessation 03/2023.   FGR: diagnosed at 24wks in 7%ile, follow up at 27wks was 35%ile. 32wks EFW back down to 4.5%ile. Most recent growth at 35wks EFW 5%ile. Her weekly dopplers and BPPs have been reassuring. MFM suspects constitutionally small. Recommended delivery at 37wks. On 4/17 at [redacted]w[redacted]d, BPP and doppler reassuring, MFM counseled pt on delivery timing as she was desiring to wait until 39wks. They agreed to do a final growth US  on 4/22 at [redacted]w[redacted]d.   Patient has some back and pelvic pain, BH ctx, not sleeping well. She declines cervical exam today.   Patient reports: Movement: Present Contractions: Irritability Denies vaginal bleeding or leaking fluid. Objective  Flow sheet Vitals: Pulse Rate: 94 BP: 113/76 Fundal Height: 35 cm Fetal Heart Rate (bpm): 127 Total weight gain: 26 lb (11.8 kg)  General Appearance  No acute distress, well appearing, and well nourished Pulmonary   Normal work of breathing Neurologic   Alert and oriented to person, place, and time Psychiatric   Mood and affect within normal limits  Assessment/Plan   Plan  23 y.o. G1P0000 at [redacted]w[redacted]d by LMP=8wk US  presents for follow-up OB visit. Reviewed prenatal record including previous visit note.  1. Supervision of high risk pregnancy in third trimester -Labor precautions reviewed -Discussed GBS negative result  2. Poor fetal growth affecting management of mother in second trimester, single or unspecified fetus (Primary) -Discussed MFM recommendation for IOL at 37wks due to severe FGR, last EFW 5%ile at 35wks -Pt and Dr. Grayland Le had discussion re: her desire to wait until 39wks; a final growth is scheduled for 4/24 and pt agreed to make delivery plans after that US .  -Today, we discussed further why MFM has recommended IOL at 37wks and  pt agreed to schedule IOL for 4/25, [redacted]w[redacted]d. If by chance her final US  shows marked, significant growth, and they give permission to wait until 39wks, we can push her IOL date back. Discussed the unlikelihood of this and to prepare for 4/25.  -IOL plan of care and expectations reviewed  3. Marijuana use during pregnancy -Was using THC gummies, cessation reported Dec '24 -UDS on admission for IOL    Future Appointments  Date Time Provider Department Center  08/20/2023  8:00 AM [facility] PROVIDER 1 [facility]  08/20/2023  8:30 AM [facility] US6 [facility]    For next visit:   MFM US  on 4/24; IOL scheduled on 08/21/23 at 8a    Sofia Dunn, DO [facility]

## 2023-08-20 ENCOUNTER — Ambulatory Visit: Attending: Obstetrics | Admitting: Obstetrics

## 2023-08-20 ENCOUNTER — Ambulatory Visit

## 2023-08-20 VITALS — BP 119/83 | HR 89

## 2023-08-20 DIAGNOSIS — O365930 Maternal care for other known or suspected poor fetal growth, third trimester, not applicable or unspecified: Secondary | ICD-10-CM

## 2023-08-20 DIAGNOSIS — O99323 Drug use complicating pregnancy, third trimester: Secondary | ICD-10-CM

## 2023-08-20 DIAGNOSIS — O365920 Maternal care for other known or suspected poor fetal growth, second trimester, not applicable or unspecified: Secondary | ICD-10-CM

## 2023-08-20 DIAGNOSIS — O0992 Supervision of high risk pregnancy, unspecified, second trimester: Secondary | ICD-10-CM

## 2023-08-20 DIAGNOSIS — Z3A38 38 weeks gestation of pregnancy: Secondary | ICD-10-CM | POA: Insufficient documentation

## 2023-08-20 DIAGNOSIS — F1290 Cannabis use, unspecified, uncomplicated: Secondary | ICD-10-CM | POA: Diagnosis not present

## 2023-08-20 NOTE — Progress Notes (Signed)
 MFM Consult Note  Lindsay Bennett is currently at 38 weeks and 0 days.  She has been followed due to IUGR.  She denies any problems since her last exam and reports feeling vigorous fetal movements throughout the day.  On today's exam, the overall EFW of 5 pounds 9 ounces measures at the 4th percentile for her gestational age indicating IUGR.  A BPP performed today was 8/8.  The total AFI was 17.55 cm (within normal limits).  Doppler studies of the umbilical arteries performed due to fetal growth restriction showed a normal S/D ratio of 1.98.  There were no signs of absent or reversed end-diastolic flow noted today.  The fetus is in the vertex presentation.  Due to IUGR, she is already scheduled for delivery tomorrow.    As both parents are only 5 feet 2 inches and 4 feet 11 inches tall, the patient was reassured that the baby is most likely constitutionally small and will continue to grow after birth.    She stated that all of her questions were answered today.  A total of 10 minutes was spent counseling and coordinating the care for this patient.  Greater than 50% of the time was spent in direct face-to-face contact.

## 2023-08-21 ENCOUNTER — Inpatient Hospital Stay
Admission: RE | Admit: 2023-08-21 | Discharge: 2023-08-22 | DRG: 806 | Disposition: A | Discharge status: Home / Self Care | Attending: Certified Nurse Midwife | Admitting: Certified Nurse Midwife

## 2023-08-21 ENCOUNTER — Other Ambulatory Visit: Payer: Self-pay

## 2023-08-21 ENCOUNTER — Encounter: Payer: Self-pay | Admitting: Obstetrics

## 2023-08-21 ENCOUNTER — Inpatient Hospital Stay: Admitting: Anesthesiology

## 2023-08-21 DIAGNOSIS — Z8249 Family history of ischemic heart disease and other diseases of the circulatory system: Secondary | ICD-10-CM

## 2023-08-21 DIAGNOSIS — O365920 Maternal care for other known or suspected poor fetal growth, second trimester, not applicable or unspecified: Secondary | ICD-10-CM

## 2023-08-21 DIAGNOSIS — O365990 Maternal care for other known or suspected poor fetal growth, unspecified trimester, not applicable or unspecified: Secondary | ICD-10-CM

## 2023-08-21 DIAGNOSIS — F1290 Cannabis use, unspecified, uncomplicated: Secondary | ICD-10-CM | POA: Diagnosis present

## 2023-08-21 DIAGNOSIS — K219 Gastro-esophageal reflux disease without esophagitis: Secondary | ICD-10-CM | POA: Diagnosis present

## 2023-08-21 DIAGNOSIS — O9962 Diseases of the digestive system complicating childbirth: Secondary | ICD-10-CM | POA: Diagnosis present

## 2023-08-21 DIAGNOSIS — O99324 Drug use complicating childbirth: Secondary | ICD-10-CM | POA: Diagnosis present

## 2023-08-21 DIAGNOSIS — Z6791 Unspecified blood type, Rh negative: Secondary | ICD-10-CM | POA: Diagnosis not present

## 2023-08-21 DIAGNOSIS — O365930 Maternal care for other known or suspected poor fetal growth, third trimester, not applicable or unspecified: Principal | ICD-10-CM | POA: Diagnosis present

## 2023-08-21 DIAGNOSIS — F1291 Cannabis use, unspecified, in remission: Secondary | ICD-10-CM

## 2023-08-21 DIAGNOSIS — O26893 Other specified pregnancy related conditions, third trimester: Secondary | ICD-10-CM | POA: Diagnosis present

## 2023-08-21 DIAGNOSIS — Z3A38 38 weeks gestation of pregnancy: Secondary | ICD-10-CM | POA: Diagnosis not present

## 2023-08-21 DIAGNOSIS — Z833 Family history of diabetes mellitus: Secondary | ICD-10-CM | POA: Diagnosis not present

## 2023-08-21 DIAGNOSIS — O99320 Drug use complicating pregnancy, unspecified trimester: Secondary | ICD-10-CM

## 2023-08-21 DIAGNOSIS — O0992 Supervision of high risk pregnancy, unspecified, second trimester: Principal | ICD-10-CM

## 2023-08-21 HISTORY — DX: Maternal care for other known or suspected poor fetal growth, unspecified trimester, not applicable or unspecified: O36.5990

## 2023-08-21 LAB — URINE DRUG SCREEN, QUALITATIVE (ARMC ONLY)
Amphetamines, Ur Screen: NOT DETECTED
Barbiturates, Ur Screen: NOT DETECTED
Benzodiazepine, Ur Scrn: NOT DETECTED
Cannabinoid 50 Ng, Ur ~~LOC~~: POSITIVE — AB
Cocaine Metabolite,Ur ~~LOC~~: NOT DETECTED
MDMA (Ecstasy)Ur Screen: NOT DETECTED
Methadone Scn, Ur: NOT DETECTED
Opiate, Ur Screen: NOT DETECTED
Phencyclidine (PCP) Ur S: NOT DETECTED
Tricyclic, Ur Screen: NOT DETECTED

## 2023-08-21 LAB — URINALYSIS, ROUTINE W REFLEX MICROSCOPIC
Bilirubin Urine: NEGATIVE
Glucose, UA: NEGATIVE mg/dL
Hgb urine dipstick: NEGATIVE
Ketones, ur: NEGATIVE mg/dL
Nitrite: NEGATIVE
Protein, ur: NEGATIVE mg/dL
Specific Gravity, Urine: 1.004 — ABNORMAL LOW (ref 1.005–1.030)
pH: 7 (ref 5.0–8.0)

## 2023-08-21 LAB — CBC
HCT: 33.3 % — ABNORMAL LOW (ref 36.0–46.0)
Hemoglobin: 11.3 g/dL — ABNORMAL LOW (ref 12.0–15.0)
MCH: 29.7 pg (ref 26.0–34.0)
MCHC: 33.9 g/dL (ref 30.0–36.0)
MCV: 87.6 fL (ref 80.0–100.0)
Platelets: 271 10*3/uL (ref 150–400)
RBC: 3.8 MIL/uL — ABNORMAL LOW (ref 3.87–5.11)
RDW: 13.8 % (ref 11.5–15.5)
WBC: 12.3 10*3/uL — ABNORMAL HIGH (ref 4.0–10.5)
nRBC: 0 % (ref 0.0–0.2)

## 2023-08-21 LAB — TYPE AND SCREEN
ABO/RH(D): A NEG
Antibody Screen: POSITIVE

## 2023-08-21 LAB — ABO/RH: ABO/RH(D): A NEG

## 2023-08-21 MED ORDER — COCONUT OIL OIL: 1.0000 | TOPICAL_OIL | Status: DC | PRN

## 2023-08-21 MED ORDER — MISOPROSTOL 25 MCG QUARTER TABLET
25.0000 ug | ORAL_TABLET | Freq: Once | ORAL | Status: DC
  Filled 2023-08-21: qty 1

## 2023-08-21 MED ORDER — LIDOCAINE-EPINEPHRINE (PF) 1.5 %-1:200000 IJ SOLN
INTRAMUSCULAR | Status: DC | PRN
  Administered 2023-08-21: 3 mL via EPIDURAL

## 2023-08-21 MED ORDER — MISOPROSTOL 25 MCG QUARTER TABLET
25.0000 ug | ORAL_TABLET | Freq: Once | ORAL | Status: AC
  Administered 2023-08-21: 25 ug via ORAL
  Filled 2023-08-21: qty 1

## 2023-08-21 MED ORDER — OXYTOCIN-SODIUM CHLORIDE 30-0.9 UT/500ML-% IV SOLN
2.5000 [IU]/h | INTRAVENOUS | Status: DC
  Administered 2023-08-21: 2.5 [IU]/h via INTRAVENOUS

## 2023-08-21 MED ORDER — OXYTOCIN-SODIUM CHLORIDE 30-0.9 UT/500ML-% IV SOLN
INTRAVENOUS | Status: AC
Start: 2023-08-21 — End: 2023-08-21
  Administered 2023-08-21: 333 mL via INTRAVENOUS
  Filled 2023-08-21: qty 500

## 2023-08-21 MED ORDER — DOCUSATE SODIUM 100 MG PO CAPS
100.0000 mg | ORAL_CAPSULE | Freq: Two times a day (BID) | ORAL | Status: DC
  Administered 2023-08-22: 100 mg via ORAL
  Filled 2023-08-21: qty 1

## 2023-08-21 MED ORDER — BENZOCAINE-MENTHOL 20-0.5 % EX AERO
1.0000 | INHALATION_SPRAY | CUTANEOUS | Status: DC | PRN
  Administered 2023-08-21: 1 via TOPICAL
  Filled 2023-08-21: qty 56

## 2023-08-21 MED ORDER — MISOPROSTOL 50MCG HALF TABLET: 50.0000 ug | ORAL_TABLET | Freq: Once | ORAL | Status: AC

## 2023-08-21 MED ORDER — BUPIVACAINE HCL (PF) 0.25 % IJ SOLN
INTRAMUSCULAR | Status: DC | PRN
  Administered 2023-08-21: 2 mL via EPIDURAL
  Administered 2023-08-21: 4 mL via EPIDURAL

## 2023-08-21 MED ORDER — DIBUCAINE (PERIANAL) 1 % EX OINT
1.0000 | TOPICAL_OINTMENT | CUTANEOUS | Status: DC | PRN
  Administered 2023-08-21: 1 via RECTAL
  Filled 2023-08-21: qty 28

## 2023-08-21 MED ORDER — ACETAMINOPHEN 500 MG PO TABS
1000.0000 mg | ORAL_TABLET | Freq: Four times a day (QID) | ORAL | Status: DC | PRN
  Administered 2023-08-21 – 2023-08-22 (×4): 1000 mg via ORAL
  Filled 2023-08-21 (×4): qty 2

## 2023-08-21 MED ORDER — FENTANYL-BUPIVACAINE-NACL 0.5-0.125-0.9 MG/250ML-% EP SOLN
EPIDURAL | Status: AC
  Filled 2023-08-21: qty 250

## 2023-08-21 MED ORDER — IBUPROFEN 600 MG PO TABS
600.0000 mg | ORAL_TABLET | Freq: Four times a day (QID) | ORAL | Status: DC
  Administered 2023-08-21 – 2023-08-22 (×5): 600 mg via ORAL
  Filled 2023-08-21 (×5): qty 1

## 2023-08-21 MED ORDER — LACTATED RINGERS IV SOLN: 500.0000 mL | INTRAVENOUS | Status: DC | PRN

## 2023-08-21 MED ORDER — SIMETHICONE 80 MG PO CHEW: 80.0000 mg | CHEWABLE_TABLET | ORAL | Status: DC | PRN

## 2023-08-21 MED ORDER — SOD CITRATE-CITRIC ACID 500-334 MG/5ML PO SOLN: 30.0000 mL | ORAL | Status: DC | PRN

## 2023-08-21 MED ORDER — WITCH HAZEL-GLYCERIN EX PADS
1.0000 | MEDICATED_PAD | CUTANEOUS | Status: DC | PRN
  Administered 2023-08-21: 1 via TOPICAL
  Filled 2023-08-21: qty 100

## 2023-08-21 MED ORDER — LIDOCAINE HCL (PF) 1 % IJ SOLN: 30.0000 mL | INTRAMUSCULAR | Status: DC | PRN

## 2023-08-21 MED ORDER — MISOPROSTOL 50MCG HALF TABLET: 50.0000 ug | ORAL_TABLET | ORAL | Status: DC | PRN

## 2023-08-21 MED ORDER — TERBUTALINE SULFATE 1 MG/ML IJ SOLN
INTRAMUSCULAR | Status: AC
Start: 2023-08-21 — End: 2023-08-22
  Filled 2023-08-21: qty 1

## 2023-08-21 MED ORDER — HYDROXYZINE HCL 25 MG PO TABS
50.0000 mg | ORAL_TABLET | Freq: Four times a day (QID) | ORAL | Status: DC | PRN
Start: 2023-08-21 — End: 2023-08-21

## 2023-08-21 MED ORDER — FENTANYL CITRATE (PF) 100 MCG/2ML IJ SOLN
50.0000 ug | INTRAMUSCULAR | Status: DC | PRN
  Administered 2023-08-21: 50 ug via INTRAVENOUS
  Administered 2023-08-21: 100 ug via INTRAVENOUS
  Filled 2023-08-21 (×2): qty 2

## 2023-08-21 MED ORDER — ONDANSETRON HCL 4 MG/2ML IJ SOLN
4.0000 mg | INTRAMUSCULAR | Status: DC | PRN
Start: 2023-08-21 — End: 2023-08-23

## 2023-08-21 MED ORDER — ONDANSETRON HCL 4 MG PO TABS: 4.0000 mg | ORAL_TABLET | ORAL | Status: DC | PRN

## 2023-08-21 MED ORDER — ACETAMINOPHEN 500 MG PO TABS: 1000.0000 mg | ORAL_TABLET | Freq: Four times a day (QID) | ORAL | Status: DC | PRN

## 2023-08-21 MED ORDER — TERBUTALINE SULFATE 1 MG/ML IJ SOLN
0.2500 mg | Freq: Once | INTRAMUSCULAR | Status: AC | PRN
Start: 2023-08-21 — End: 2023-08-21
  Administered 2023-08-21: 0.25 mg via SUBCUTANEOUS

## 2023-08-21 MED ORDER — DIPHENHYDRAMINE HCL 25 MG PO CAPS: 25.0000 mg | ORAL_CAPSULE | Freq: Four times a day (QID) | ORAL | Status: DC | PRN

## 2023-08-21 MED ORDER — LACTATED RINGERS IV SOLN: INTRAVENOUS | Status: DC

## 2023-08-21 MED ORDER — PHENYLEPHRINE 80 MCG/ML (10ML) SYRINGE FOR IV PUSH (FOR BLOOD PRESSURE SUPPORT): 80.0000 ug | PREFILLED_SYRINGE | INTRAVENOUS | Status: DC | PRN

## 2023-08-21 MED ORDER — FENTANYL-BUPIVACAINE-NACL 0.5-0.125-0.9 MG/250ML-% EP SOLN
12.0000 mL/h | EPIDURAL | Status: DC | PRN
  Administered 2023-08-21: 12 mL/h via EPIDURAL

## 2023-08-21 MED ORDER — ONDANSETRON HCL 4 MG/2ML IJ SOLN
4.0000 mg | Freq: Four times a day (QID) | INTRAMUSCULAR | Status: DC | PRN
Start: 2023-08-21 — End: 2023-08-21

## 2023-08-21 MED ORDER — OXYTOCIN 10 UNIT/ML IJ SOLN
INTRAMUSCULAR | Status: AC
  Filled 2023-08-21: qty 2

## 2023-08-21 MED ORDER — LIDOCAINE HCL (PF) 1 % IJ SOLN
INTRAMUSCULAR | Status: AC
  Filled 2023-08-21: qty 30

## 2023-08-21 MED ORDER — DIPHENHYDRAMINE HCL 50 MG/ML IJ SOLN: 12.5000 mg | INTRAMUSCULAR | Status: DC | PRN

## 2023-08-21 MED ORDER — LIDOCAINE HCL (PF) 1 % IJ SOLN
INTRAMUSCULAR | Status: DC | PRN
  Administered 2023-08-21: 3 mL via SUBCUTANEOUS

## 2023-08-21 MED ORDER — EPHEDRINE 5 MG/ML INJ
10.0000 mg | INTRAVENOUS | Status: DC | PRN
  Filled 2023-08-21: qty 5

## 2023-08-21 MED ORDER — MISOPROSTOL 200 MCG PO TABS
ORAL_TABLET | ORAL | Status: AC
  Filled 2023-08-21: qty 4

## 2023-08-21 MED ORDER — OXYTOCIN BOLUS FROM INFUSION: 333.0000 mL | Freq: Once | INTRAVENOUS | Status: AC

## 2023-08-21 MED ORDER — EPHEDRINE 5 MG/ML INJ
10.0000 mg | INTRAVENOUS | Status: DC | PRN
  Administered 2023-08-21: 10 mg via INTRAVENOUS

## 2023-08-21 MED ORDER — CALCIUM CARBONATE ANTACID 500 MG PO CHEW: 2.0000 | CHEWABLE_TABLET | Freq: Three times a day (TID) | ORAL | Status: DC | PRN

## 2023-08-21 MED ORDER — ZOLPIDEM TARTRATE 5 MG PO TABS: 5.0000 mg | ORAL_TABLET | Freq: Every evening | ORAL | Status: DC | PRN

## 2023-08-21 MED ORDER — MAGNESIUM HYDROXIDE 400 MG/5ML PO SUSP: 30.0000 mL | ORAL | Status: DC | PRN

## 2023-08-21 MED ORDER — AMMONIA AROMATIC IN INHA
RESPIRATORY_TRACT | Status: DC
Start: 2023-08-21 — End: 2023-08-21
  Filled 2023-08-21: qty 10

## 2023-08-21 MED ORDER — OXYTOCIN-SODIUM CHLORIDE 30-0.9 UT/500ML-% IV SOLN: 1.0000 m[IU]/min | INTRAVENOUS | Status: DC

## 2023-08-21 MED ORDER — MISOPROSTOL 50MCG HALF TABLET
ORAL_TABLET | ORAL | Status: AC
  Administered 2023-08-21: 50 ug via ORAL
  Filled 2023-08-21: qty 1

## 2023-08-21 MED ORDER — LACTATED RINGERS IV SOLN: 500.0000 mL | Freq: Once | INTRAVENOUS | Status: DC

## 2023-08-21 MED ORDER — FERROUS SULFATE 325 (65 FE) MG PO TABS
325.0000 mg | ORAL_TABLET | Freq: Every day | ORAL | Status: DC
  Administered 2023-08-22: 325 mg via ORAL
  Filled 2023-08-21: qty 1

## 2023-08-21 MED ORDER — PRENATAL MULTIVITAMIN CH
1.0000 | ORAL_TABLET | Freq: Every day | ORAL | Status: DC
  Filled 2023-08-21: qty 1

## 2023-08-21 NOTE — H&P (Signed)
 [facility] Labor & Delivery  History and Physical   HPI   Chief Complaint: here for scheduled induction of labor  Lindsay Bennett is a 23 y.o. G1P0000 at [redacted]w[redacted]d who presents for induction of labor due to IUGR. Endorses fetal movement. Denies painful contractions. Denies loss of fluid or vaginal bleeding.   Here with partner/FOB Lindsay Bennett. Expecting [name].  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   HISTORY   Medications Medications Prior to Admission  Medication Sig Dispense Refill Last Dose/Taking   ondansetron  (ZOFRAN ) 4 MG tablet Take 4 mg by mouth every 8 (eight) hours as needed for nausea or vomiting.   08/20/2023   Prenatal Vit-Fe Fumarate-FA (MULTIVITAMIN-PRENATAL) 27-0.8 MG TABS tablet Take 1 tablet by mouth daily at 12 noon.   08/20/2023   promethazine  (PHENERGAN ) 25 MG tablet Take 1 tablet (25 mg total) by mouth every 6 (six) hours as needed for nausea or vomiting. (Patient not taking: Reported on 08/21/2023) 30 tablet 0 Not Taking    Allergies is allergic to buspirone.   OB History OB History  Gravida Para Term Preterm AB Living  1 0 0 0 0 0  SAB IAB Ectopic Multiple Live Births  0 0 0 0 0    # Outcome Date GA Lbr Len/2nd Weight Sex Type Anes PTL Lv  1 Current             Past Medical History Past Medical History:  Diagnosis Date   Acid reflux    Anxiety    Backache    Cephalalgia 09/20/2013   Depression    Diarrhea, functional     Dysmenorrhea    Eating disorder    Head ache    Heart palpitations    Seasonal allergies    Syncope and collapse 01/17/2014   Vomiting    Weight loss 05/05/2016    Past Surgical History Past Surgical History:  Procedure Laterality Date   colonscopy  06/2015   DENTAL SURGERY     age 48   WISDOM TOOTH EXTRACTION  2019   four;    Social History  reports that she has never smoked. She has been exposed to tobacco smoke. She has never used smokeless tobacco. She reports that she does not currently use drugs. She reports that she does not drink alcohol.   Family History family history includes Cancer in an other family member; Diabetes in her maternal grandfather; Healthy in her sister; Hypertension in her maternal grandfather; Lung cancer (age of onset: 14) in her maternal grandmother; Sickle cell trait in her father; Stroke in her maternal grandfather and maternal grandmother.   PHYSICAL EXAM   There were no vitals filed for this visit.  Constitutional: No acute distress, well appearing, and well nourished. Neurologic: She is alert and conversational.  Psychiatric: She has a normal mood and affect.  Musculoskeletal: Normal gait, grossly normal range of motion Cardiovascular: Normal rate.   Pulmonary/Chest: Normal work of breathing.  Gastrointestinal/Abdominal: Soft. Gravid. There is no tenderness.  Skin:  Skin is warm and dry. No rash noted.  Genitourinary: Normal external female genitalia.  SVE:   Dilation: Fingertip Effacement (%): 70 Station: -2 Exam by:: Irine Manning, CNM   NST Interpretation Indication: IOL Baseline: 135 bpm Variability: moderate Accelerations: present Decelerations: absent Contractions: irregular, q5-34m, 45-60s, mild, sfot resting tone Time noted:  See OBIX Impression: reactive Authenticated by: Forestine Igo    PRENATAL LABS FROM OB RESULTS CONSOLE  ABO, Rh: --/--/A NEG Performed at [facility], [address] [place],  [place] [address]  [phone]) Antibody: POS (04/25 0840) Rubella: 1.86 (10/24 1026) RPR: Non Reactive (02/18 0922)  HBsAg: Negative (10/24 1026)  HIV: Non Reactive (02/18 8413)  KGM:WNUUVOZD/-- (04/09 1119)  ENCOUNTER LABS    Results for orders placed or performed during the hospital encounter of 08/21/23 (from the past 72 hours)  CBC     Status: Abnormal   Collection Time: 08/21/23  8:40 AM  Result Value Ref Range   WBC 12.3 (H) 4.0 - 10.5 K/uL   RBC 3.80 (L) 3.87 - 5.11 MIL/uL   Hemoglobin 11.3 (L) 12.0 - 15.0 g/dL   HCT 66.4 (L) 40.3 - 47.4 %   MCV 87.6 80.0 - 100.0 fL   MCH 29.7 26.0 - 34.0 pg   MCHC 33.9 30.0 - 36.0 g/dL   RDW 25.9 56.3 - 87.5 %   Platelets 271 150 - 400 K/uL   nRBC 0.0 0.0 - 0.2 %    Comment: Performed at [facility], [address] [place], [place] [address]  Type and screen     Status: None (Preliminary result)   Collection Time: 08/21/23  8:40 AM  Result Value Ref Range   ABO/RH(D) A NEG    Antibody Screen POS    Sample Expiration      08/24/2023,2359 Performed at [facility], [address] [place], [place] [address]    Antibody Identification PENDING   Urinalysis, Routine w reflex microscopic -Urine, Clean Catch     Status: Abnormal   Collection Time: 08/21/23  8:40 AM  Result Value Ref Range   Color, Urine YELLOW (A) YELLOW   APPearance HAZY (A) CLEAR   Specific Gravity, Urine 1.004 (L) 1.005 - 1.030   pH 7.0 5.0 - 8.0   Glucose, UA NEGATIVE NEGATIVE mg/dL   Hgb urine dipstick NEGATIVE NEGATIVE   Bilirubin Urine NEGATIVE NEGATIVE   Ketones, ur NEGATIVE NEGATIVE mg/dL   Protein, ur NEGATIVE NEGATIVE mg/dL   Nitrite NEGATIVE NEGATIVE   Leukocytes,Ua TRACE (A) NEGATIVE   RBC / HPF 0-5 0 - 5 RBC/hpf   WBC, UA 0-5 0 - 5 WBC/hpf   Bacteria, UA RARE (A) NONE SEEN   Squamous Epithelial / HPF 11-20 0 - 5 /HPF    Comment: Performed at [facility], [address] [place], [place] [address]  Urine Drug Screen,  Qualitative ([facility] only)     Status: Abnormal   Collection Time: 08/21/23  8:40 AM  Result Value Ref Range   Tricyclic, Ur Screen NONE DETECTED NONE DETECTED   Amphetamines, Ur Screen NONE DETECTED NONE DETECTED   MDMA (Ecstasy)Ur Screen NONE DETECTED NONE DETECTED   Cocaine Metabolite,Ur [place] NONE DETECTED NONE DETECTED   Opiate, Ur Screen NONE DETECTED NONE DETECTED   Phencyclidine (PCP) Ur S NONE DETECTED NONE DETECTED   Cannabinoid 50 Ng, Ur  POSITIVE (A) NONE DETECTED   Barbiturates, Ur Screen NONE DETECTED NONE DETECTED   Benzodiazepine, Ur Scrn NONE DETECTED NONE DETECTED  Methadone Scn, Ur NONE DETECTED NONE DETECTED    Comment: (NOTE) Tricyclics + metabolites, urine    Cutoff 1000 ng/mL Amphetamines + metabolites, urine  Cutoff 1000 ng/mL MDMA (Ecstasy), urine              Cutoff 500 ng/mL Cocaine Metabolite, urine          Cutoff 300 ng/mL Opiate + metabolites, urine        Cutoff 300 ng/mL Phencyclidine (PCP), urine         Cutoff 25 ng/mL Cannabinoid, urine                 Cutoff 50 ng/mL Barbiturates + metabolites, urine  Cutoff 200 ng/mL Benzodiazepine, urine              Cutoff 200 ng/mL Methadone, urine                   Cutoff 300 ng/mL  The urine drug screen provides only a preliminary, unconfirmed analytical test result and should not be used for non-medical purposes. Clinical consideration and professional judgment should be applied to any positive drug screen result due to possible interfering substances. A more specific alternate chemical method must be used in order to obtain a confirmed analytical result. Gas chromatography / mass spectrometry (GC/MS) is the preferred confirm atory method. Performed at [facility], [address] [place], [place] [address]   ABO/Rh     Status: None (Preliminary result)   Collection Time: 08/21/23  8:41 AM  Result Value Ref Range   ABO/RH(D) PENDING   ABO/Rh     Status: None   Collection Time: 08/21/23  9:28  AM  Result Value Ref Range   ABO/RH(D)      A NEG Performed at [facility], [address] [place], [place] [address]        ASSESSMENT AND PLAN   Lindsay Bennett is a 23 y.o. G1P0000 at [redacted]w[redacted]d with EDD: 09/03/2023, by Last Menstrual Period admitted for induction of labor due to Poor fetal growth..  Misoprostol  for ripening, consider Cook catheter with next SVE  Fetal Status: - cephalic presentation by SVE, ultrasound 4/24 - EFW: 2536 gm 5 lb 9 oz 4.4 % by ultrasound 08/20/23 - CEFM - FHT currently cat I  GBS negative - no prophylaxis indicated  Hx of marijuana use in pregnancy - UDS ordered - notify pediatric service  Rh negative - collect cord blood at delivery  Labs/Immunizations: TDAP: received prenatally 06/16/23 Flu: received prenatally 02/19/23 Rubella: immune Varicella: immune HIV: NR Hep B: negative Hep C: NR RPR: NR GBS: negative  Lab Results  Component Value Date   VZVIGG Reactive 02/19/2023   HIV Non Reactive 06/16/2023     Postpartum Plan: - Feeding: Formula - Contraception: plans undecided - Prenatal Care Provider: AOB  Attending Dr. Luster Salters was immediately available for the care of the patient.

## 2023-08-21 NOTE — Anesthesia Procedure Notes (Addendum)
 Epidural Patient location during procedure: OB  Staffing Resident/CRNA: Philippe Brazen, CRNA Other anesthesia staff: Kavin Parsley, CRNA Performed: resident/CRNA   Preanesthetic Checklist Completed: patient identified, IV checked, site marked, risks and benefits discussed, surgical consent, monitors and equipment checked, pre-op evaluation and timeout performed  Epidural Patient position: sitting Prep: ChloraPrep and site prepped and draped Patient monitoring: heart rate, continuous pulse ox and blood pressure Approach: midline Location: L4-L5 Injection technique: LOR saline  Needle:  Needle type: Tuohy  Needle gauge: 17 G Needle length: 9 cm and 9 Needle insertion depth: 5.5 cm Catheter type: closed end flexible Catheter size: 19 Gauge Catheter at skin depth: 11 cm Test dose: negative and 1.5% lidocaine  with Epi 1:200 K  Assessment Events: blood not aspirated, no cerebrospinal fluid, injection not painful, no injection resistance, no paresthesia and negative IV test  Additional Notes   Patient tolerated the insertion well without complications.Reason for block:procedure for pain

## 2023-08-21 NOTE — Discharge Summary (Signed)
 Postpartum Discharge Summary  Date of Service updated 08/22/23     Patient Name: Lindsay Bennett DOB: 2001-04-12 MRN: [id number]  Date of admission: 08/21/2023 Delivery date:08/21/2023 Delivering provider: Forestine Igo Date of discharge: 08/22/2023  Admitting diagnosis: Fetal growth restriction antepartum [O36.5990] Intrauterine pregnancy: [redacted]w[redacted]d     Secondary diagnosis:  Principal Problem:   Fetal growth restriction antepartum Active Problems:   Vaginal delivery   Postpartum care following vaginal delivery  Additional problems: NA    Discharge diagnosis: Term Pregnancy Delivered                                              Post partum procedures:rhogam Augmentation: AROM and Cytotec  Complications: None  Hospital course: Induction of Labor With Vaginal Delivery   23 y.o. yo G1P1001 at [redacted]w[redacted]d was admitted to the hospital 08/21/2023 for induction of labor.  Indication for induction:  severe fetal growth restriction .  Patient had an labor course complicated by FHR decelerations Membrane Rupture Time/Date: 4:31 PM,08/21/2023  Delivery Method:Vaginal, Spontaneous Operative Delivery:N/A Episiotomy: None Lacerations:  Labial  Pt. Is eating, hydrating, and voiding regularly without difficulty. Has yet to have BM. She is strictly bottle feeding per choice. Reports small amount of vaginal bleeding, denies passing large blood clots. Has had cramping abdomen pain relieved with tylenol /ibuprofen . Plans to use progesterone only pills for contraception. Denies anxiety/depression symptoms. Endorses good support from partner and family.    Patient was encouraged to stay an additional night due to baby being small and born only yesterday evening. Patient insisted upon wanting to go home. The pediatrician was comfortable with baby being discharged today pending 24 hour testing and if family did not do a circumcision before discharge. Family agreed. Before discharge today she will receive  rhogam, have transition of care consult for hx of positive UDS and have UDS completed on baby. Maguire is medically stable and from an OB perspective can be discharged home. Orders will be placed and discharge can occur following transition of care consult and 24 hour testing on baby.    Newborn Data: Birth date:08/21/2023 Birth time:6:21 PM Gender:Female Living status:Living Apgars:8 ,8  Weight:2640 g  Magnesium Sulfate received: No BMZ received: No Rhophylac :Yes MMR:N/A T-DaP:Given prenatally Flu: No RSV Vaccine received: No Transfusion:No Immunizations administered: Immunization History  Administered Date(s) Administered   DTaP 09/07/1998, 12/19/1998, 02/26/1999, 08/26/1999   Dtap, Unspecified 07/25/2002   Fluzone Influenza virus vaccine,trivalent (IIV3), split virus 05/27/2016   HIB, Unspecified 09/07/1998, 12/19/1998, 02/26/1999, 08/26/1999   HPV Quadrivalent 07/17/2011, 11/20/2012, 08/19/2013   Hep A, Unspecified 03/02/2006   Hep B, Unspecified 02/26/1999, 06/12/1999, 07/17/1999   Hepatitis A, Ped/Adol-2 Dose 05/14/2007   Hepatitis B, PED/ADOLESCENT 04/05/2010   IPV 09/07/1998, 12/19/1998, 06/12/1999   Influenza Nasal 02/14/2008, 02/20/2009, 02/16/2010, 04/15/2011   Influenza, Seasonal, Injecte, Preservative Fre 02/19/2023   Influenza,Quad,Nasal, Live 05/21/2012, 01/12/2013, 01/28/2014   Influenza,inj,Quad PF,6+ Mos 01/24/2015, 03/06/2022   MMR 07/17/1999, 07/25/2002   Meningococcal Conjugate 04/05/2010, 12/17/2015   PPD Test 12/17/2015   Pneumococcal Conjugate PCV 7 06/12/1999, 08/26/1999, 10/10/1999   Polio, Unspecified 07/25/2002   Tdap 04/05/2010, 06/16/2023   Varicella 07/17/1999, 03/02/2006    Physical exam  Vitals:   08/21/23 2122 08/22/23 0044 08/22/23 0432 08/22/23 0817  BP: 134/78 111/71 (!) 99/51 123/87  Pulse: 74 (!) 58 67 63  Resp: 18 18 20  16  Temp: 98 F (36.7 C) 98.4 F (36.9 C) (!) 97.5 F (36.4 C) 98.6 F (37 C)  TempSrc: Oral Oral Oral  Oral  SpO2: 100% 99% 99% 100%   General: alert, cooperative, and no distress Lochia: appropriate Uterine Fundus: firm Incision: N/A DVT Evaluation: No evidence of DVT seen on physical exam. Negative Homan's sign. No cords or calf tenderness. Labs: Lab Results  Component Value Date   WBC 17.9 (H) 08/22/2023   HGB 10.8 (L) 08/22/2023   HCT 31.0 (L) 08/22/2023   MCV 87.6 08/22/2023   PLT 231 08/22/2023      Latest Ref Rng & Units 02/18/2017   10:41 AM  CMP  Glucose 65 - 99 mg/dL 87   BUN 6 - 20 mg/dL 12   Creatinine 6.57 - 1.00 mg/dL 8.46   Sodium 962 - 952 mmol/L 140   Potassium 3.5 - 5.1 mmol/L 4.0   Chloride 101 - 111 mmol/L 104   CO2 22 - 32 mmol/L 26   Calcium 8.9 - 10.3 mg/dL 9.6    Edinburgh Score:    08/21/2023    9:35 PM  Edinburgh Postnatal Depression Scale Screening Tool  I have been able to laugh and see the funny side of things. 0  I have looked forward with enjoyment to things. 0  I have blamed myself unnecessarily when things went wrong. 0  I have been anxious or worried for no good reason. 0  I have felt scared or panicky for no good reason. 0  Things have been getting on top of me. 0  I have been so unhappy that I have had difficulty sleeping. 0  I have felt sad or miserable. 0  I have been so unhappy that I have been crying. 0  The thought of harming myself has occurred to me. 0  Edinburgh Postnatal Depression Scale Total 0      After visit meds:  Allergies as of 08/22/2023       Reactions   Buspirone Itching, Other (See Comments)   Agitation        Medication List     STOP taking these medications    multivitamin-prenatal 27-0.8 MG Tabs tablet   ondansetron  4 MG tablet Commonly known as: ZOFRAN    promethazine  25 MG tablet Commonly known as: PHENERGAN          Discharge home in stable condition Infant Feeding: Bottle Infant Disposition:home with mother Discharge instruction: per After Visit Summary and Postpartum  booklet. Activity: Advance as tolerated. Pelvic rest for 6 weeks.  Diet: routine diet Anticipated Birth Control: POPs Postpartum Appointment:6 weeks Additional Postpartum F/U: Postpartum Depression checkup Future Appointments:No future appointments. Follow up Visit:  Follow-up Information     Forestine Igo, CNM. Schedule an appointment as soon as possible for a visit in 2 week(s).   Specialty: Certified Nurse Midwife Why: Virtual postpartum mood check Contact information: [address] [place] [address] [phone]         Forestine Igo, CNM. Schedule an appointment as soon as possible for a visit in 6 week(s).   Specialty: Certified Nurse Midwife Why: Postpartum visit Contact information: [address] [place] [address] [phone]                     08/22/2023 Lou Rounds, CNM 10:17 AM

## 2023-08-21 NOTE — Progress Notes (Signed)
 Labor Progress Note   ASSESSMENT/PLAN   Lindsay Bennett 23 y.o.   G1P0000  at [redacted]w[redacted]d here with IOL for IUGR, now completely dilated.  FWB:  - Fetal well being assessed:   Category II, decels after epidural & rapid dilation     GBS: - GBS negative  LABOR: - Now in active labor, doing well. - Pain Management: epidural in place - Discussed options with patient and AROM performed, FSE placed - Anticipate SVD   Principal Problem:   Fetal growth restriction antepartum   SUBJECTIVE/OBJECTIVE   SUBJECTIVE: Feeling pressure with contractions with but pain improved    OBJECTIVE: Vital Signs: Patient Vitals for the past 12 hrs:  BP Temp Temp src Pulse Resp  08/21/23 1619 -- (!) 97.5 F (36.4 C) Oral -- --  08/21/23 1436 129/89 -- -- 75 18  08/21/23 1100 (!) 134/90 98.2 F (36.8 C) Oral 65 18  08/21/23 0814 119/69 -- -- 72 16    Last SVE:  Dilation: 9 Effacement (%): 100 Station: 0 Presentation: Vertex Exam by:: Irine Manning -  , Rupture Date: 08/21/23, Rupture Time: 1631,    FHR:   - Mode: External  - Baseline Rate (A): 110 bpm  -  Moderate variability  - Characteristics (ie - accels, decels): Accelerations: 15 x 15, late, prolonged deceleration  -    UTERINE ACTIVITY:   - Mode: Toco  - Contraction Frequency (min): 1.5-2.5 minutes

## 2023-08-21 NOTE — Anesthesia Preprocedure Evaluation (Signed)
 Anesthesia Evaluation  Patient identified by MRN, date of birth, ID band Patient awake    Reviewed: Allergy & Precautions, H&P , NPO status , Patient's Chart, lab work & pertinent test results  Airway Mallampati: III  TM Distance: >3 FB     Dental  (+) Dental Advidsory Given   Pulmonary neg pulmonary ROS          Cardiovascular negative cardio ROS      Neuro/Psych  Headaches  Anxiety Depression       GI/Hepatic Neg liver ROS,GERD  ,,  Endo/Other  negative endocrine ROS    Renal/GU negative Renal ROS  negative genitourinary   Musculoskeletal   Abdominal   Peds  Hematology negative hematology ROS (+)   Anesthesia Other Findings   Reproductive/Obstetrics (+) Pregnancy                             Anesthesia Physical Anesthesia Plan  ASA: 2  Anesthesia Plan: Epidural   Post-op Pain Management:    Induction:   PONV Risk Score and Plan:   Airway Management Planned:   Additional Equipment:   Intra-op Plan:   Post-operative Plan:   Informed Consent: I have reviewed the patients History and Physical, chart, labs and discussed the procedure including the risks, benefits and alternatives for the proposed anesthesia with the patient or authorized representative who has indicated his/her understanding and acceptance.       Plan Discussed with: CRNA  Anesthesia Plan Comments:        Anesthesia Quick Evaluation

## 2023-08-21 NOTE — Discharge Instructions (Signed)

## 2023-08-22 LAB — CBC
HCT: 31 % — ABNORMAL LOW (ref 36.0–46.0)
Hemoglobin: 10.8 g/dL — ABNORMAL LOW (ref 12.0–15.0)
MCH: 30.5 pg (ref 26.0–34.0)
MCHC: 34.8 g/dL (ref 30.0–36.0)
MCV: 87.6 fL (ref 80.0–100.0)
Platelets: 231 10*3/uL (ref 150–400)
RBC: 3.54 MIL/uL — ABNORMAL LOW (ref 3.87–5.11)
RDW: 13.6 % (ref 11.5–15.5)
WBC: 17.9 10*3/uL — ABNORMAL HIGH (ref 4.0–10.5)
nRBC: 0 % (ref 0.0–0.2)

## 2023-08-22 LAB — FETAL SCREEN: Fetal Screen: NEGATIVE

## 2023-08-22 LAB — RPR: RPR Ser Ql: NONREACTIVE

## 2023-08-22 MED ORDER — RHO D IMMUNE GLOBULIN 1500 UNIT/2ML IJ SOSY
300.0000 ug | PREFILLED_SYRINGE | Freq: Once | INTRAMUSCULAR | Status: AC
  Administered 2023-08-22: 300 ug via INTRAVENOUS
  Filled 2023-08-22: qty 2

## 2023-08-22 NOTE — Anesthesia Post-op Follow-up Note (Signed)
  Anesthesia Pain Follow-up Note  Patient: Lindsay Bennett  Day #: 1  Date of Follow-up: 08/22/2023 Time: 9:18 AM  Last Vitals:  Vitals:   08/22/23 0432 08/22/23 0817  BP: (!) 99/51 123/87  Pulse: 67 63  Resp: 20 16  Temp: (!) 36.4 C 37 C  SpO2: 99% 100%    Level of Consciousness: alert  Pain: none   Side Effects:None  Catheter Site Exam:clean, dry, no drainage     Plan: D/C from anesthesia care at surgeon's request  [employer]

## 2023-08-22 NOTE — Anesthesia Postprocedure Evaluation (Signed)
 Anesthesia Post Note  Patient: Lindsay Bennett  Procedure(s) Performed: AN AD HOC LABOR EPIDURAL  Patient location during evaluation: Mother Baby Anesthesia Type: Epidural Level of consciousness: awake and alert Pain management: pain level controlled Vital Signs Assessment: post-procedure vital signs reviewed and stable Respiratory status: spontaneous breathing, nonlabored ventilation and respiratory function stable Cardiovascular status: stable Postop Assessment: no headache, no backache and epidural receding Anesthetic complications: no  No notable events documented.   Last Vitals:  Vitals:   08/22/23 0432 08/22/23 0817  BP: (!) 99/51 123/87  Pulse: 67 63  Resp: 20 16  Temp: (!) 36.4 C 37 C  SpO2: 99% 100%    Last Pain:  Vitals:   08/22/23 0817  TempSrc: Oral  PainSc:                  Enrique Harvest

## 2023-08-22 NOTE — Clinical Social Work Maternal (Addendum)
  CLINICAL SOCIAL WORK MATERNAL/CHILD NOTE  Patient Details  Name: Lindsay Bennett MRN: [id number] Date of Birth: Apr 17, 2001  Date:  08/22/2023  Clinical Social Worker Initiating Note:  Holland Lundborg Date/Time: Initiated:  08/22/23/1148     Child's Name:  [name]   Biological Parents:  Mother, Father   Need for Interpreter:  None   Reason for Referral:  Current Substance Use/Substance Use During Pregnancy     Address:  [address] [place] [address] [place] [address]    Phone number:  [phone] (home)     Additional phone number: [phone] (mother)  Household Members/Support Persons (HM/SP):   Household Member/Support Person 3   HM/SP Name Relationship DOB or Age  HM/SP -1        HM/SP -2        HM/SP -3 Heather, Gradie Lawless Mother, sister, grandmother    HM/SP -4        HM/SP -5        HM/SP -6        HM/SP -7        HM/SP -8          Natural Supports (not living in the home):  Extended Family   Professional Supports: None   Employment: Unemployed   Type of Work: None   Education:  Attending college   Homebound arranged:    Surveyor, quantity Resources:  Medicaid   Other Resources:  Sales executive     Cultural/Religious Considerations Which May Impact Care:  None  Strengths:  Ability to meet basic needs  , Understanding of illness, Compliance with medical plan  , Home prepared for child  , Pediatrician chosen   Psychotropic Medications:         Pediatrician:    [employer]  Pediatrician List:   [employer]    [place]    [facility]  [facility]      Pediatrician Fax Number:    Risk Factors/Current Problems:  Substance Use     Cognitive State:  Alert     Mood/Affect:  Calm     CSW Assessment: Patient doing well since delivery, denies any concerns.  Aware that baby's drug screen remain pending and CPS will be notified if positive.  She verbalizes understanding.   Update @  1550:  Baby's urine tested positive for cannabinoid, CPS report made to [facility] with [facility] DSS, aware of discharge pending for today.  CSW Plan/Description:  CSW Will Continue to Monitor Umbilical Cord Tissue Drug Screen Results and Make Report if [facility], RN 08/22/2023, 12:04 PM

## 2023-08-22 NOTE — Progress Notes (Addendum)
 Pt escorted to exit by [facility] RN via wheelchair with infant in arms.  Aox4, ambulatory and decreased pain endorsed by patient.   Discharge paperwork given, instructions counseled.

## 2023-08-23 LAB — RHOGAM INJECTION: Unit division: 0

## 2023-08-25 LAB — SURGICAL PATHOLOGY

## 2023-08-25 NOTE — Addendum Note (Signed)
 Addendum  created 08/25/23 1420 by Vanice Genre, MD   Attestation recorded in Intraprocedure, Intraprocedure Attestations filed

## 2023-08-28 ENCOUNTER — Other Ambulatory Visit: Payer: Self-pay

## 2023-08-28 ENCOUNTER — Observation Stay
Admission: EM | Admit: 2023-08-28 | Discharge: 2023-08-29 | Discharge status: Against Medical Advice | Attending: Obstetrics | Admitting: Obstetrics

## 2023-08-28 DIAGNOSIS — O135 Gestational [pregnancy-induced] hypertension without significant proteinuria, complicating the puerperium: Principal | ICD-10-CM | POA: Diagnosis present

## 2023-08-28 DIAGNOSIS — O165 Unspecified maternal hypertension, complicating the puerperium: Secondary | ICD-10-CM | POA: Diagnosis present

## 2023-08-28 DIAGNOSIS — Z8249 Family history of ischemic heart disease and other diseases of the circulatory system: Secondary | ICD-10-CM

## 2023-08-28 LAB — CBC
HCT: 34.1 % — ABNORMAL LOW (ref 36.0–46.0)
Hemoglobin: 11.2 g/dL — ABNORMAL LOW (ref 12.0–15.0)
MCH: 29.8 pg (ref 26.0–34.0)
MCHC: 32.8 g/dL (ref 30.0–36.0)
MCV: 90.7 fL (ref 80.0–100.0)
Platelets: 548 10*3/uL — ABNORMAL HIGH (ref 150–400)
RBC: 3.76 MIL/uL — ABNORMAL LOW (ref 3.87–5.11)
RDW: 13.4 % (ref 11.5–15.5)
WBC: 14.2 10*3/uL — ABNORMAL HIGH (ref 4.0–10.5)
nRBC: 0 % (ref 0.0–0.2)

## 2023-08-28 LAB — URINALYSIS, ROUTINE W REFLEX MICROSCOPIC
Bilirubin Urine: NEGATIVE
Glucose, UA: NEGATIVE mg/dL
Ketones, ur: NEGATIVE mg/dL
Nitrite: NEGATIVE
Protein, ur: NEGATIVE mg/dL
Specific Gravity, Urine: 1.008 (ref 1.005–1.030)
WBC, UA: >50 WBC/hpf (ref 0–5)
pH: 7 (ref 5.0–8.0)

## 2023-08-28 LAB — COMPREHENSIVE METABOLIC PANEL WITH GFR
ALT: 25 U/L (ref 0–44)
AST: 36 U/L (ref 15–41)
Albumin: 3.3 g/dL — ABNORMAL LOW (ref 3.5–5.0)
Alkaline Phosphatase: 111 U/L (ref 38–126)
Anion gap: 11 (ref 5–15)
BUN: 8 mg/dL (ref 6–20)
CO2: 24 mmol/L (ref 22–32)
Calcium: 9.2 mg/dL (ref 8.9–10.3)
Chloride: 104 mmol/L (ref 98–111)
Creatinine, Ser: 0.74 mg/dL (ref 0.44–1.00)
GFR, Estimated: >60 mL/min (ref >60)
Glucose, Bld: 81 mg/dL (ref 70–99)
Potassium: 3.4 mmol/L — ABNORMAL LOW (ref 3.5–5.1)
Sodium: 139 mmol/L (ref 135–145)
Total Bilirubin: 0.5 mg/dL (ref 0.0–1.2)
Total Protein: 6.8 g/dL (ref 6.5–8.1)

## 2023-08-28 LAB — PROTEIN / CREATININE RATIO, URINE
Creatinine, Urine: 71 mg/dL
Protein Creatinine Ratio: 0.15 mg/mg{creat} (ref 0.00–0.15)
Total Protein, Urine: 11 mg/dL

## 2023-08-28 MED ORDER — LACTATED RINGERS IV SOLN: INTRAVENOUS | Status: DC

## 2023-08-28 MED ORDER — LIDOCAINE HCL URETHRAL/MUCOSAL 2 % EX GEL
1.0000 | Freq: Once | CUTANEOUS | Status: AC
  Administered 2023-08-28: 1 via TOPICAL
  Filled 2023-08-28: qty 5

## 2023-08-28 MED ORDER — LABETALOL HCL 5 MG/ML IV SOLN
80.0000 mg | INTRAVENOUS | Status: DC | PRN
  Administered 2023-08-28: 80 mg via INTRAVENOUS
  Filled 2023-08-28: qty 16

## 2023-08-28 MED ORDER — ACETAMINOPHEN 325 MG PO TABS: 650.0000 mg | ORAL_TABLET | ORAL | Status: DC | PRN

## 2023-08-28 MED ORDER — MAGNESIUM SULFATE 40 GM/1000ML IV SOLN
2.0000 g/h | INTRAVENOUS | Status: DC
Start: 2023-08-28 — End: 2023-08-29
  Administered 2023-08-28: 2 g/h via INTRAVENOUS
  Filled 2023-08-28: qty 1000

## 2023-08-28 MED ORDER — LABETALOL HCL 5 MG/ML IV SOLN
20.0000 mg | INTRAVENOUS | Status: DC | PRN
  Administered 2023-08-28: 20 mg via INTRAVENOUS
  Filled 2023-08-28: qty 4

## 2023-08-28 MED ORDER — LABETALOL HCL 5 MG/ML IV SOLN
40.0000 mg | INTRAVENOUS | Status: DC | PRN
  Administered 2023-08-28: 40 mg via INTRAVENOUS
  Filled 2023-08-28: qty 8

## 2023-08-28 MED ORDER — HYDROXYZINE HCL 25 MG PO TABS: 50.0000 mg | ORAL_TABLET | Freq: Four times a day (QID) | ORAL | Status: DC | PRN

## 2023-08-28 MED ORDER — HYDRALAZINE HCL 20 MG/ML IJ SOLN: 10.0000 mg | INTRAMUSCULAR | Status: DC | PRN

## 2023-08-28 MED ORDER — LABETALOL HCL 5 MG/ML IV SOLN: 20.0000 mg | INTRAVENOUS | Status: DC | PRN

## 2023-08-28 MED ORDER — SOD CITRATE-CITRIC ACID 500-334 MG/5ML PO SOLN: 30.0000 mL | ORAL | Status: DC | PRN

## 2023-08-28 MED ORDER — LABETALOL HCL 100 MG PO TABS
200.0000 mg | ORAL_TABLET | Freq: Two times a day (BID) | ORAL | Status: DC
  Administered 2023-08-28: 200 mg via ORAL
  Filled 2023-08-28: qty 2

## 2023-08-28 MED ORDER — LABETALOL HCL 5 MG/ML IV SOLN
40.0000 mg | INTRAVENOUS | Status: DC | PRN
  Filled 2023-08-28: qty 8

## 2023-08-28 MED ORDER — LABETALOL HCL 5 MG/ML IV SOLN: 80.0000 mg | INTRAVENOUS | Status: DC | PRN

## 2023-08-28 MED ORDER — LACTATED RINGERS IV BOLUS
500.0000 mL | Freq: Once | INTRAVENOUS | Status: AC
  Administered 2023-08-28: 500 mL via INTRAVENOUS

## 2023-08-28 MED ORDER — MAGNESIUM SULFATE BOLUS VIA INFUSION
4.0000 g | Freq: Once | INTRAVENOUS | Status: AC
  Administered 2023-08-28: 4 g via INTRAVENOUS
  Filled 2023-08-28: qty 1000

## 2023-08-28 MED ORDER — ONDANSETRON HCL 4 MG/2ML IJ SOLN
4.0000 mg | Freq: Four times a day (QID) | INTRAMUSCULAR | Status: DC | PRN
Start: 2023-08-28 — End: 2023-08-29

## 2023-08-28 NOTE — ED Triage Notes (Signed)
 Pt comes in via pov with complaints of dizziness, hypertension, nausea, swelling in the feet/ hands and headaches. Pt states that she just had a baby 4/25, and has been having these symptoms ever since. Pt has a history on anemia.  Pt is alert and oriented x4, and seems lethargic on presentation.

## 2023-08-28 NOTE — Progress Notes (Signed)
 I was asked to speak to [facility] regarding her desire to leave AMA at 2200. She reports that she feels terrible from the magnesium  and that it makes her head feel "not right." Her IV site is causing a lot of discomfort. We discussed the common but unfortunately uncomfortable side effects of magnesium  and why magnesium  is recommended. I explained the increased risk of seizure, stroke, and death with severe range blood pressures, and reiterated the recommendation to continue magnesium  for 24 hours. We also discussed that she is an autonomous individual and cannot be held in the hospital against her will.  I offered to place an IV in a different site and remove the painful one. Lindsay Bennett declines a change in IV site. She is willing to stay for blood pressure monitoring if magnesium  is discontinued.   Lindsay Bennett M Krosby Ritchie, CNM

## 2023-08-28 NOTE — H&P (Signed)
 [facility] Postpartum History and Physical  ASSESSMENT AND PLAN   Lindsay Bennett is a 23 y.o. G1P1001 s/p NSVD on 08/21/23 presenting with severe range blood pressures. Her pregnancy was complicated by severe FGR and THC use. She did not have a diagnosis of a hypertensive disorder during pregnancy or in the immediate postpartum period.  1. Admit to L&D   2. Magnesium  sulfate x 24 hours per protocol 3. IV labetalol  protocol for severe range BPs 4. PO labetalol  200 mg BID 5. Repeat labs in AM  Plan made in consultation with Dr. Dell Fennel.  HPI   Chief Complaint: Elevated blood pressure  Lindsay Bennett is a 23 y.o. G1P1001 at 7 days postpartum who presents for severe hypertension and "feeling off." She reports that she had a headache last night not but does not currently have one. She denies epigastric pain. She has had intermittent visual disturbances and has been become lightheaded on several occasions. She reports a history of vertigo pre-dating this pregnancy, and the symptoms have worsened. She feels generally unwell. She reports that she has not been eating or drinking as much as she should have been, and in addition to caring for the baby, has other animal-care and physical responsibilities in the home.  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   HISTORY   Medications No medications prior to admission.    Allergies is allergic to buspirone.   OB History OB History  Gravida Para Term  Preterm AB Living  1 1 1  0 0 1  SAB IAB Ectopic Multiple Live Births  0 0 0 0 1    # Outcome Date GA Lbr Len/2nd Weight Sex Type Anes PTL Lv  1 Term 08/21/23 [redacted]w[redacted]d 07:15 / 01:44 2640 g M Vag-Spont EPI  LIV     Name: Lindsay Bennett     Apgar1: 8  Apgar5: 8    Past Medical History Past Medical History:  Diagnosis Date   Acid reflux    Anxiety    Backache    Cephalalgia 09/20/2013   Depression    Diarrhea, functional    Dysmenorrhea    Eating disorder    Fetal growth restriction antepartum 08/21/2023   Head ache    Heart palpitations    Poor fetal growth affecting management of mother in second trimester 05/14/2023   05/14/23: Growth US  at [redacted]w[redacted]d: EFW 7%ile, UAD nml; MFM consult pending; Q3-4wk growths, weekly BPP w/dopplers, delivery by 38-39wks pending growth  06/05/23: MFM consult - growth 35%ile, follow up in 5 weeks for growth - scheduled 3/14  3/14: 4.5%ile, BPP 10/10  - Continue weekly UA Dopplers and BPPs.  If umbilical artery Dopplers become elevated with periods of absent end-diastolic flow she should ha   Seasonal allergies    Syncope and collapse 01/17/2014   Vomiting    Weight loss 05/05/2016    Past Surgical History Past Surgical History:  Procedure Laterality Date   colonscopy  06/2015   DENTAL SURGERY     age 39   WISDOM  TOOTH EXTRACTION  2019   four;    Social History  reports that she has never smoked. She has been exposed to tobacco smoke. She has never used smokeless tobacco. She reports that she does not currently use drugs. She reports that she does not drink alcohol.   Family History family history includes Cancer in an other family member; Diabetes in her maternal grandfather; Healthy in her sister; Hypertension in her maternal grandfather; Lung cancer (age of onset: 53) in her maternal grandmother; Sickle cell trait in her father; Stroke in her maternal grandfather and maternal grandmother.   PHYSICAL EXAM   Vitals:   08/28/23 1309 08/28/23  1324 08/28/23 1339 08/28/23 1354  BP: (!) 156/89 (!) 168/98 (!) 164/97 (!) 131/95  Pulse: (!) 55 64 (!) 55 90  Resp:      Temp:      TempSrc:      SpO2:      Weight:      Height:        Constitutional: No acute distress, pale. Neurologic: She is alert and conversational.  Psychiatric: She has a normal mood and affect.  Musculoskeletal: Normal gait, grossly normal range of motion Cardiovascular: RRR.   Pulmonary/Chest: Normal work of breathing.  Gastrointestinal/Abdominal: Soft. Fundus at U-4. Mildly tender to palpation. Skin: Skin is warm and dry. No rash noted.      Affan Callow M Morayo Leven, CNM

## 2023-08-28 NOTE — OB Triage Note (Signed)
 Pt reports "not feeling right " since she delivered on the 25th of April. Reports swlling in her legs. Pitting edema present on assessment. Denies HA at this time. Reports visual disturbances today. Also c/o nausea. 3+ reflexes noted, no clonus. Jarvis Mesa

## 2023-08-28 NOTE — ED Notes (Addendum)
 Called and talked to Charge RN in LDR and they will take pt and RN updated that we drew blood and triaged her before. Pt also have IV line in place and already provided urine sample that we sent to lab.

## 2023-08-29 MED ORDER — LABETALOL HCL 200 MG PO TABS: 200.0000 mg | ORAL_TABLET | Freq: Two times a day (BID) | ORAL | 3 refills | Status: AC

## 2023-08-29 NOTE — Discharge Summary (Signed)
 Postpartum Discharge Summary  Date of Service updated 08/29/23      Patient Name: Lindsay Bennett DOB: 2000/12/14 MRN: [id number]  Date of admission: 08/28/2023 Delivery date:This patient has no babies on file. Delivering provider: This patient has no babies on file. Date of discharge: 08/29/2023  Admitting diagnosis: Labor and delivery, indication for care [O75.9] Postpartum hypertension [O16.5] Intrauterine pregnancy: Unknown     Secondary diagnosis:  Principal Problem:   Postpartum hypertension Active Problems:   Labor and delivery, indication for care  Additional problems: none    Discharge diagnosis: Gestational Hypertension                                               Hospital course:  Admitted for severe range HTN. Labs were negative for preeclampsia. She received multiple doses of IV labetalol . She received magnesium  for seizure prophylaxis for approximately 8 hours. Her blood pressures normalized. She decided that she no longer wanted to receive magnesium  or blood pressure monitoring, and she left AMA.    Magnesium  Sulfate received: Yes: Seizure prophylaxis Immunizations administered: Immunization History  Administered Date(s) Administered   DTaP 09/07/1998, 12/19/1998, 02/26/1999, 08/26/1999   Dtap, Unspecified 07/25/2002   Fluzone Influenza virus vaccine,trivalent (IIV3), split virus 05/27/2016   HIB, Unspecified 09/07/1998, 12/19/1998, 02/26/1999, 08/26/1999   HPV Quadrivalent 07/17/2011, 11/20/2012, 08/19/2013   Hep A, Unspecified 03/02/2006   Hep B, Unspecified 02/26/1999, 06/12/1999, 07/17/1999   Hepatitis A, Ped/Adol-2 Dose 05/14/2007   Hepatitis B, PED/ADOLESCENT 04/05/2010   IPV 09/07/1998, 12/19/1998, 06/12/1999   Influenza Nasal 02/14/2008, 02/20/2009, 02/16/2010, 04/15/2011   Influenza, Seasonal, Injecte, Preservative Fre 02/19/2023   Influenza,Quad,Nasal, Live 05/21/2012, 01/12/2013, 01/28/2014   Influenza,inj,Quad PF,6+ Mos 01/24/2015, 03/06/2022    MMR 07/17/1999, 07/25/2002   Meningococcal Conjugate 04/05/2010, 12/17/2015   PPD Test 12/17/2015   Pneumococcal Conjugate PCV 7 06/12/1999, 08/26/1999, 10/10/1999   Polio, Unspecified 07/25/2002   Tdap 04/05/2010, 06/16/2023   Varicella 07/17/1999, 03/02/2006    Physical exam  Vitals:   08/28/23 2100 08/28/23 2218 08/28/23 2219 08/28/23 2328  BP: 115/80 116/83 116/83 104/63  Pulse: 68 75 75 79  Resp: 18  18   Temp: 97.9 F (36.6 C)     TempSrc: Oral     SpO2: 99%     Weight:      Height:       General: alert, cooperative, and no distress Lochia: appropriate Uterine Fundus: firm Incision: N/A DVT Evaluation: No evidence of DVT seen on physical exam. Labs: Lab Results  Component Value Date   WBC 14.2 (H) 08/28/2023   HGB 11.2 (L) 08/28/2023   HCT 34.1 (L) 08/28/2023   MCV 90.7 08/28/2023   PLT 548 (H) 08/28/2023      Latest Ref Rng & Units 08/28/2023   10:40 AM  CMP  Glucose 70 - 99 mg/dL 81   BUN 6 - 20 mg/dL 8   Creatinine 3.24 - 4.01 mg/dL 0.27   Sodium 253 - 664 mmol/L 139   Potassium 3.5 - 5.1 mmol/L 3.4   Chloride 98 - 111 mmol/L 104   CO2 22 - 32 mmol/L 24   Calcium  8.9 - 10.3 mg/dL 9.2   Total Protein 6.5 - 8.1 g/dL 6.8   Total Bilirubin 0.0 - 1.2 mg/dL 0.5   Alkaline Phos 38 - 126 U/L 111   AST 15 - 41  U/L 36   ALT 0 - 44 U/L 25         After visit meds:  Allergies as of 08/29/2023       Reactions   Buspirone Itching, Other (See Comments)   Agitation        Medication List     TAKE these medications    labetalol  200 MG tablet Commonly known as: NORMODYNE  Take 1 tablet (200 mg total) by mouth 2 (two) times daily.         Discharge home in stable condition  Discharge instruction: BP check in clinic on Monday.Start labetalol  200 mg BID Activity: Advance as tolerated. Pelvic rest for 6 weeks.  Diet: routine diet Postpartum Appointment: BP check Monday. Needs 2 week video visit and 6 week office visit scheduled.     08/29/2023 Phylliss Brenner, CNM

## 2023-08-29 NOTE — Progress Notes (Signed)
 Lindsay Bennett states that she does not want to stay for further monitoring. She understands the risks and the recommendation for continued monitoring. I informed her that she can return to the hospital at any time and she will receive care. She is open to scheduling a BP check at the clinic this week and states she will monitor her BP at home. Rx sent for labetalol  200 mg BID. AMA papers signed.  Jadan Hinojos M Ronson Hagins, CNM

## 2023-08-29 NOTE — Progress Notes (Signed)
 Pt states that she does not want to stay for further monitoring. CNM Swanson and this RN at bedside to explain risks of leaving AMA. Pt acknowledged risks and states that she will come back if she has further problems.

## 2023-09-01 ENCOUNTER — Ambulatory Visit

## 2023-09-01 VITALS — BP 109/80 | HR 77 | Ht 59.0 in | Wt 91.3 lb

## 2023-09-01 DIAGNOSIS — Z0130 Encounter for examination of blood pressure without abnormal findings: Secondary | ICD-10-CM

## 2023-09-01 DIAGNOSIS — Z8759 Personal history of other complications of pregnancy, childbirth and the puerperium: Secondary | ICD-10-CM

## 2023-09-01 NOTE — Patient Instructions (Signed)
 Managing Your Hypertension Hypertension, also called high blood pressure, is when the force of the blood pressing against the walls of the arteries is too strong. Arteries are blood vessels that carry blood from your heart throughout your body. Hypertension forces the heart to work harder to pump blood and may cause the arteries to become narrow or stiff. Understanding blood pressure readings A blood pressure reading includes a higher number over a lower number: The first, or top, number is called the systolic pressure. It is a measure of the pressure in your arteries as your heart beats. The second, or bottom number, is called the diastolic pressure. It is a measure of the pressure in your arteries as the heart relaxes. For most people, a normal blood pressure is below 120/80. Your personal target blood pressure may vary depending on your medical conditions, your age, and other factors. Blood pressure is classified into four stages. Based on your blood pressure reading, your health care provider may use the following stages to determine what type of treatment you need, if any. Systolic pressure and diastolic pressure are measured in a unit called millimeters of mercury (mmHg). Normal Systolic pressure: below 120. Diastolic pressure: below 80. Elevated Systolic pressure: 120-129. Diastolic pressure: below 80. Hypertension stage 1 Systolic pressure: 130-139. Diastolic pressure: 80-89. Hypertension stage 2 Systolic pressure: 140 or above. Diastolic pressure: 90 or above. How can this condition affect me? Managing your hypertension is very important. Over time, hypertension can damage the arteries and decrease blood flow to parts of the body, including the brain, heart, and kidneys. Having untreated or uncontrolled hypertension can lead to: A heart attack. A stroke. A weakened blood vessel (aneurysm). Heart failure. Kidney damage. Eye damage. Memory and concentration problems. Vascular  dementia. What actions can I take to manage this condition? Hypertension can be managed by making lifestyle changes and possibly by taking medicines. Your health care provider will help you make a plan to bring your blood pressure within a normal range. You may be referred for counseling on a healthy diet and physical activity. Nutrition  Eat a diet that is high in fiber and potassium, and low in salt (sodium), added sugar, and fat. An example eating plan is called the DASH diet. DASH stands for Dietary Approaches to Stop Hypertension. To eat this way: Eat plenty of fresh fruits and vegetables. Try to fill one-half of your plate at each meal with fruits and vegetables. Eat whole grains, such as whole-wheat pasta, brown rice, or whole-grain bread. Fill about one-fourth of your plate with whole grains. Eat low-fat dairy products. Avoid fatty cuts of meat, processed or cured meats, and poultry with skin. Fill about one-fourth of your plate with lean proteins such as fish, chicken without skin, beans, eggs, and tofu. Avoid pre-made and processed foods. These tend to be higher in sodium, added sugar, and fat. Reduce your daily sodium intake. Many people with hypertension should eat less than 1,500 mg of sodium a day. Lifestyle  Work with your health care provider to maintain a healthy body weight or to lose weight. Ask what an ideal weight is for you. Get at least 30 minutes of exercise that causes your heart to beat faster (aerobic exercise) most days of the week. Activities may include walking, swimming, or biking. Include exercise to strengthen your muscles (resistance exercise), such as weight lifting, as part of your weekly exercise routine. Try to do these types of exercises for 30 minutes at least 3 days a week. Do  not use any products that contain nicotine or tobacco. These products include cigarettes, chewing tobacco, and vaping devices, such as e-cigarettes. If you need help quitting, ask your  health care provider. Control any long-term (chronic) conditions you have, such as high cholesterol or diabetes. Identify your sources of stress and find ways to manage stress. This may include meditation, deep breathing, or making time for fun activities. Alcohol use Do not drink alcohol if: Your health care provider tells you not to drink. You are pregnant, may be pregnant, or are planning to become pregnant. If you drink alcohol: Limit how much you have to: 0-1 drink a day for women. 0-2 drinks a day for men. Know how much alcohol is in your drink. In the [place] one drink equals one 12 oz bottle of beer (355 mL), one 5 oz glass of wine (148 mL), or one 1 oz glass of hard liquor (44 mL). Medicines Your health care provider may prescribe medicine if lifestyle changes are not enough to get your blood pressure under control and if: Your systolic blood pressure is 130 or higher. Your diastolic blood pressure is 80 or higher. Take medicines only as told by your health care provider. Follow the directions carefully. Blood pressure medicines must be taken as told by your health care provider. The medicine does not work as well when you skip doses. Skipping doses also puts you at risk for problems. Monitoring Before you monitor your blood pressure: Do not smoke, drink caffeinated beverages, or exercise within 30 minutes before taking a measurement. Use the bathroom and empty your bladder (urinate). Sit quietly for at least 5 minutes before taking measurements. Monitor your blood pressure at home as told by your health care provider. To do this: Sit with your back straight and supported. Place your feet flat on the floor. Do not cross your legs. Support your arm on a flat surface, such as a table. Make sure your upper arm is at heart level. Each time you measure, take two or three readings one minute apart and record the results. You may also need to have your blood pressure checked regularly by  your health care provider. General information Talk with your health care provider about your diet, exercise habits, and other lifestyle factors that may be contributing to hypertension. Review all the medicines you take with your health care provider because there may be side effects or interactions. Keep all follow-up visits. Your health care provider can help you create and adjust your plan for managing your high blood pressure. Where to find more information National Heart, Lung, and Blood Institute: PopSteam.is American Heart Association: www.heart.org Contact a health care provider if: You think you are having a reaction to medicines you have taken. You have repeated (recurrent) headaches. You feel dizzy. You have swelling in your ankles. You have trouble with your vision. Get help right away if: You develop a severe headache or confusion. You have unusual weakness or numbness, or you feel faint. You have severe pain in your chest or abdomen. You vomit repeatedly. You have trouble breathing. These symptoms may be an emergency. Get help right away. Call 911. Do not wait to see if the symptoms will go away. Do not drive yourself to the hospital. Summary Hypertension is when the force of blood pumping through your arteries is too strong. If this condition is not controlled, it may put you at risk for serious complications. Your personal target blood pressure may vary depending on your medical conditions,  your age, and other factors. For most people, a normal blood pressure is less than 120/80. Hypertension is managed by lifestyle changes, medicines, or both. Lifestyle changes to help manage hypertension include losing weight, eating a healthy, low-sodium diet, exercising more, stopping smoking, and limiting alcohol. This information is not intended to replace advice given to you by your health care provider. Make sure you discuss any questions you have with your health care  provider. Document Revised: 12/27/2020 Document Reviewed: 12/27/2020 Elsevier Patient Education  2024 [employer].

## 2023-09-01 NOTE — Progress Notes (Deleted)
    NURSE VISIT NOTE  Subjective:    Patient ID: OLYVIA DUBOSE, female    DOB: [birth year], 23 y.o.   MRN: [id number]  HPI  Patient is a 23 y.o. G31P1001 female who presents for BP check per order from Josue Nip, CNM.   Patient reports compliance with prescribed BP medications: {YES/NO/WILD CARDS:18581} Labetalol  200 mgtwice daily Last dose of BP medication: {Elopement time:3044023}  BP Readings from Last 3 Encounters:  08/29/23 110/71  08/22/23 131/88  08/20/23 119/83   Pulse Readings from Last 3 Encounters:  08/29/23 72  08/22/23 72  08/20/23 89    Objective:    There were no vitals taken for this visit.  Assessment:   No diagnosis found.   Plan:   Per Dr. Lazaro Prime Providers:28529}:  {ZHYQMV:78469:G}  Patient verbalized understanding of instructions.   Inga Manges, CMA

## 2023-09-01 NOTE — Progress Notes (Signed)
    NURSE VISIT NOTE  Subjective:    Patient ID: BUFF NELSON, female    DOB: 2000-11-24, 23 y.o.   MRN: [id number]  HPI  Patient is a 23 y.o. G81P1001 female who presents for BP check per order from Josue Nip, CNM.   Patient reports compliance with prescribed BP medications: yes Labetalol  200 mgtwice daily Last dose of BP medication:  09/01/23 at 8AM  BP Readings from Last 3 Encounters:  09/01/23 109/80  08/29/23 110/71  08/22/23 131/88   Pulse Readings from Last 3 Encounters:  09/01/23 77  08/29/23 72  08/22/23 72    Objective:    BP 109/80   Pulse 77   Ht 4\' 11"  (1.499 m)   Wt 91 lb 4.8 oz (41.4 kg)   Breastfeeding No   BMI 18.44 kg/m   Assessment:   1. History of gestational hypertension      Plan:   Per Dr. Sofia Dunn, MD:  Continue current treatment regimen. Continue to monitor blood pressure at home. Report any reading >140/90 or with any associated symptoms. Return to clinic as scheduled.  Patient verbalized understanding of instructions.   Inga Manges, CMA

## 2023-09-07 ENCOUNTER — Telehealth: Payer: Self-pay | Admitting: Certified Nurse Midwife

## 2023-09-07 ENCOUNTER — Ambulatory Visit: Admitting: Certified Nurse Midwife

## 2023-09-07 NOTE — Telephone Encounter (Signed)
 Reached out to pt to reschedule 2  week pp visit that was scheduled on 08/28/2023 with Binnie Buffalo at 1:55.  Was able to reschedule the appt for 09/10/2023 at 1:55 with J. Irine Manning.

## 2023-09-10 ENCOUNTER — Encounter: Payer: Self-pay | Admitting: Certified Nurse Midwife

## 2023-09-10 ENCOUNTER — Ambulatory Visit (INDEPENDENT_AMBULATORY_CARE_PROVIDER_SITE_OTHER): Admitting: Certified Nurse Midwife

## 2023-09-10 DIAGNOSIS — Z8679 Personal history of other diseases of the circulatory system: Secondary | ICD-10-CM

## 2023-09-10 NOTE — Progress Notes (Signed)
    Post Partum Visit Note  Lindsay Bennett is a 23 y.o. G5P1001 female who presents for a postpartum visit. She is 3 weeks postpartum following a normal spontaneous vaginal delivery.  I have fully reviewed the prenatal and intrapartum course. The delivery was at 38 gestational weeks.  Anesthesia: epidural. Postpartum course has been complicated by elevated blood pressure at 1 week postpartum, she was admitted & received magnesium  & discharged home on labetalol . Lindsay Bennett is doing well. Lindsay is feeding by bottle - similac sensitive. Bleeding light. Bowel function is normal. Bladder function is normal. Patient is not sexually active. Contraception method is abstinence. Postpartum depression screening: negative.   The pregnancy intention screening data noted above was reviewed. Potential methods of contraception were discussed. The patient elected to proceed with No data recorded.   Edinburgh Postnatal Depression Scale - 09/10/23 1356       Edinburgh Postnatal Depression Scale:  In the Past 7 Days   I have been able to laugh and see the funny side of things. 0    I have looked forward with enjoyment to things. 0    I have blamed myself unnecessarily when things went wrong. 0    I have felt scared or panicky for no good reason. 0    Things have been getting on top of me. 0    I have been so unhappy that I have had difficulty sleeping. 0    I have felt sad or miserable. 0    I have been so unhappy that I have been crying. 0    The thought of harming myself has occurred to me. 0             Health Maintenance Due  Topic Date Due   COVID-19 Vaccine (1 - 2024-25 season) Never done    The following portions of the patient's history were reviewed and updated as appropriate: allergies, current medications, past family history, past medical history, past social history, past surgical history, and problem list.  Review of Systems Pertinent items are noted in HPI.  Objective:  BP  107/71   Pulse 97   Ht 4\' 11"  (1.499 m)   Wt 89 lb (40.4 kg)   Breastfeeding No   BMI 17.98 kg/m    General:  alert, cooperative, appears stated age, and no distress  Lungs: Normal work of breathing  Heart:  Regular rate  Abdomen: Soft, nontender, fundus@SP    GU exam:  Labial laceration well healed       Assessment/Plan:    1. Routine postpartum follow-up   2. H/O postpartum pre-eclampsia    normal postpartum exam. Normotensive today. Continue labetalol . Rescreen for depression at 6w visit.  Forestine Igo, CNM [facility], [facility]

## 2023-09-30 ENCOUNTER — Telehealth: Payer: Self-pay | Admitting: Certified Nurse Midwife

## 2023-09-30 ENCOUNTER — Ambulatory Visit: Admitting: Certified Nurse Midwife

## 2023-09-30 NOTE — Telephone Encounter (Signed)
 Reached out to pt to reschedule 6 week pp visit that was scheduled on 09/30/2023 at 10:5 with J. Irine Manning.  Was able to reschedule appt to 10/05/2023 at 9:35 with Binnie Buffalo.

## 2023-09-30 NOTE — Patient Instructions (Incomplete)

## 2023-10-05 ENCOUNTER — Ambulatory Visit: Admitting: Certified Nurse Midwife

## 2023-10-05 ENCOUNTER — Encounter: Payer: Self-pay | Admitting: Certified Nurse Midwife

## 2023-10-05 ENCOUNTER — Ambulatory Visit (INDEPENDENT_AMBULATORY_CARE_PROVIDER_SITE_OTHER): Admitting: Certified Nurse Midwife

## 2023-10-05 VITALS — Wt 84.4 lb

## 2023-10-05 DIAGNOSIS — Z3201 Encounter for pregnancy test, result positive: Secondary | ICD-10-CM | POA: Diagnosis not present

## 2023-10-05 DIAGNOSIS — Z1332 Encounter for screening for maternal depression: Secondary | ICD-10-CM

## 2023-10-05 DIAGNOSIS — Z3009 Encounter for other general counseling and advice on contraception: Secondary | ICD-10-CM

## 2023-10-05 LAB — POCT URINE PREGNANCY: Preg Test, Ur: POSITIVE — AB

## 2023-10-05 MED ORDER — NORETHIN ACE-ETH ESTRAD-FE 1-20 MG-MCG PO TABS: 1.0000 | ORAL_TABLET | Freq: Every day | ORAL | 4 refills | Status: AC

## 2023-10-05 NOTE — Progress Notes (Signed)
 Post Partum Visit Note  Lindsay Bennett is a 23 y.o. G12P1001 female who presents for a postpartum visit. She is 6 weeks postpartum following a normal spontaneous vaginal delivery.  I have fully reviewed the prenatal and intrapartum course. The delivery was at 38+1 gestational weeks.  Anesthesia: epidural. Postpartum course has been complicated by severe range blood pressures requiring IV magnesium  & oral hypertensives, has been taking labetalol  every other day. [name] is doing well. Baby is feeding by bottle-formula. Bleeding stopped, menstrual cycle returned. Bowel function is normal. Bladder function is normal. Patient is not sexually active. Contraception method is abstinence, desires to restart COC. Postpartum depression screening: negative.   Upstream - 10/05/23 1144       Pregnancy Intention Screening   Does the patient want to become pregnant in the next year? No    Does the patient's partner want to become pregnant in the next year? No    Would the patient like to discuss contraceptive options today? Yes      Contraception Wrap Up   Current Method Abstinence    End Method Oral Contraceptive    Contraception Counseling Provided Yes    How was the end contraceptive method provided? Prescription            The pregnancy intention screening data noted above was reviewed. Potential methods of contraception were discussed. The patient elected to proceed with Oral Contraceptive.   Edinburgh Postnatal Depression Scale - 10/05/23 1030       Edinburgh Postnatal Depression Scale:  In the Past 7 Days   I have been able to laugh and see the funny side of things. 0    I have looked forward with enjoyment to things. 0    I have blamed myself unnecessarily when things went wrong. 0    I have been anxious or worried for no good reason. 0    I have felt scared or panicky for no good reason. 0    Things have been getting on top of me. 0    I have been so unhappy that I have  had difficulty sleeping. 0    I have felt sad or miserable. 0    I have been so unhappy that I have been crying. 0    The thought of harming myself has occurred to me. 0    Edinburgh Postnatal Depression Scale Total 0             Health Maintenance Due  Topic Date Due   COVID-19 Vaccine (1 - 2024-25 season) Never done    The following portions of the patient's history were reviewed and updated as appropriate: allergies, current medications, past family history, past medical history, past social history, past surgical history, and problem list.  Review of Systems Pertinent items are noted in HPI.  Objective:  Wt 84 lb 6.4 oz (38.3 kg)   LMP 10/02/2023 (Exact Date)   Breastfeeding No   BMI 17.05 kg/m    General:  alert, cooperative, appears stated age, and no distress   Breasts:  normal  Lungs: clear to auscultation bilaterally  Heart:  regular rate and rhythm, S1, S2 normal, no murmur, click, rub or gallop  Abdomen: Soft, nontender, 73fb DR        Assessment:  1. Encounter for counseling regarding contraception (Primary) - POCT urine pregnancy  2. Routine postpartum follow-up  3. Encounter for screening for maternal depression `   Positive UPT:   Plan:  Essential components of care per ACOG recommendations:  1.  Mood and well being: Patient with negative depression screening today. Reviewed local resources for support.  - Patient tobacco use? Yes. Patient desires to quit? Yes.Discussed reduction and cessation  - hx of drug use? No.    2. Infant care and feeding:  -Patient currently breastmilk feeding? No.  -Social determinants of health (SDOH) reviewed in EPIC. No concerns  3. Sexuality, contraception and birth spacing - Patient does not want a pregnancy in the next year.  Desired family size is 1 children.  - Reviewed reproductive life planning. Reviewed contraceptive methods based on pt preferences and effectiveness.  Patient desired Oral Contraceptive  today.   - Discussed birth spacing of 18 months  4. Sleep and fatigue -Encouraged family/partner/community support of 4 hrs of uninterrupted sleep to help with mood and fatigue  5. Physical Recovery  - Discussed patients delivery and complications. She describes her labor as good. - Patient had a Vaginal, no problems at delivery. Patient had a labial laceration. Perineal healing reviewed. Patient expressed understanding - Patient has urinary incontinence? No. - Patient is safe to resume physical and sexual activity  6.  Health Maintenance - HM due items addressed Yes - Last pap smear  Diagnosis  Date Value Ref Range Status  02/19/2023   Final   - Negative for intraepithelial lesion or malignancy (NILM)   Pap smear not done at today's visit.  -Breast Cancer screening indicated? No.   7. Chronic Disease/Pregnancy Condition follow up: Hypertension-stop labetalol  given not taking daily & monitor BP at home, if elevated >140 or >90 contact provider to restart labetalol .  - PCP follow up   Pregnancy test returned positive, Ericca departed clinic prior to result being noted by this Clinical research associate, telephone call to [facility] & MyChart message sent to review result, reviewed in message that this may be new pregnancy, retained products from pregnancy or non-viable pregnancy. Recommend serial hcg testing to determine viability of pregnancy vs retained products.  Forestine Igo, CNM [facility], [facility]

## 2023-10-07 ENCOUNTER — Other Ambulatory Visit

## 2023-10-08 ENCOUNTER — Other Ambulatory Visit

## 2023-10-08 DIAGNOSIS — Z3201 Encounter for pregnancy test, result positive: Secondary | ICD-10-CM

## 2023-10-09 LAB — HUMAN CHORIONIC GONADOTROPIN(HCG),B-SUBUNIT,QUANTITATIVE): HCG, Beta Chain, Quant, S: 2 m[IU]/mL

## 2023-10-13 ENCOUNTER — Ambulatory Visit: Payer: Self-pay | Admitting: Certified Nurse Midwife

## 2023-10-13 ENCOUNTER — Other Ambulatory Visit

## 2024-01-25 NOTE — ED Triage Notes (Signed)
 Right flank pain x 3 days radiating around to the right side of the abdomen and ribs along with urinary urgency and frequency. Pain is worse with movement. Denies dysuria. On period currently, so does not know if she has Hematuria. Denies fever/chills, diarrhea, and blood in stool. Has had nausea and vomiting earlier today only.   No distress. HR 118, all other vitals WNL. Afebrile.

## 2024-01-27 NOTE — Progress Notes (Signed)
 " Chief Complaint  Patient presents with   Hospital Follow Up    Went to the ER left AMA     Subjective  Lindsay Bennett is a 23 y.o. female who presents for Hospital Follow Up Lindsay Bennett to the ER left AMA ) HPI History of Present Illness Lindsay Bennett is a 23 year old female who presents with back pain and symptoms suggestive of a urinary tract infection. She is accompanied by Lindsay Bennett, her boyfriend.  She visited the emergency room the previous day due to severe back pain and symptoms she suspected were related to a urinary tract infection. After waiting for several hours without receiving a full evaluation, she left the ER. While there, she was given an IV and informed of a urinary tract infection based on initial tests. She was not treated  The pain is located in her back, particularly on the right side near her ribs, and does not radiate to the groin. It is exacerbated by walking and lying down, causing difficulty sleeping. No burning sensation during urination, but she experiences chills and a decreased appetite. She experiences chills and notes her heart rate has been faster than usual.  She mentions a history of elevated white blood cell count from the ER visit, which she reviewed on her medical chart.  Socially, she occasionally consumes alcohol and smokes tobacco, primarily through vaping. She is currently not employed.  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      Objective  Vitals:   01/27/24 0939  BP: 116/80  Pulse: (!) 121  Weight: (!) 38.2 kg (84 lb 3.2 oz)  PainSc:   4  PainLoc: Back   Body mass index is 16.44 kg/m.  Home Vitals:     Physical Exam Physical Exam    Constitutional: alert, in NAD, and communicates well Respiratory: clear to auscultation, without rales or wheezes  Cardiovascular: regular rate and rhythm and without murmurs, rubs or gallops +Right cva tenderness   Assessment/Plan:   Assessment & Plan Acute pyelonephritis Suspected due to back pain, chills, leukocytosis, and right flank pain. Differential includes nephrolithiasis. Tachycardia possibly infection-related. Decreased appetite and insomnia noted. Will treat with cipro for 7 days Er precautions given Diagnoses and all orders for this visit:  Acute pyelonephritis -     ciprofloxacin HCl (CIPRO) 500 MG tablet; Take 1 tablet (500 mg total) by mouth 2 (two) times daily for 7 days    This visit was coded based on medical decision making (MDM).           Future Appointments   This patient does not currently have any appointments scheduled.     There are no Patient Instructions on file for this visit.  An after visit summary was provided  for the patient either in written format (printed) or through [facility].  This note has been created using automated tools and reviewed for accuracy by JONATHON COOPER HODGES.  "

## 2024-05-11 NOTE — Telephone Encounter (Signed)
"   No Actions Required Disposition:Office within 3 days  Patient Agreed to Disposition   Reason for call: Patient is calling for:   Pt returning missed call to reschedule appt that was just canceled. Appt was for today at 1530.  Reports hard cyst to tailbone for about a year. Area the size of a grape. Denies injury. Denies pain. Denies drainage. Denies fever.    Actions taken during call: Scheduled an Appointment.  Future Appointments     Date/Time Provider Department Center Visit Type   05/12/2024 1:45 PM (Arrive by 1:30 PM) Zachary Idelia Sherita Jock, MD [facility] OFFICE VISIT       Triage: Triage completed, care advice given per protocol.    Amy Idell Minerva, RN [facility] Patient [facility]agement [facility]  Patient Engagement Center Documentation   Care Advice given as documented. Call back parameters given and caller advised of 24 hour nurse triage. Caller verbalized understanding.    Reason for Disposition  [1] Small swelling or lump AND [2] unexplained AND [3] present > 1 week  Additional Information  Negative: Sounds like a life-threatening emergency to the triager  Negative: Small growth, spot, bump, or pigmented area of skin (e.g., moles, skin tags, wart, melanoma, skin cancer)  Negative: Inguinal hernia previously diagnosed by a physician  Negative: Followed a skin injury  Negative: Follows an insect bite  Negative: Swelling of lymph node suspected  Negative: Swelling of vaccination site  Negative: Swelling of tongue  Negative: Swelling of lip  Negative: Swelling of eye  Negative: Swelling of entire face  Negative: Swelling of scrotum  Negative: Swelling of labia  Negative: Swelling of surgical incision  Negative: Swelling of ankle joint  Negative: Swelling of elbow joint  Negative: Swelling of knee joint  Negative: Swelling with a skin rash  Negative: Patient sounds very sick or weak to the triager  Negative:  SEVERE pain (e.g., excruciating)  Negative: [1] Swelling is painful to touch AND [2] fever  Negative: [1] Swelling is red AND [2] fever  Negative: [1] Swelling is red AND [2] size > 2 inches (5.0 cm) (Exception: itchy area of skin)  Negative: [1] Swelling of groin (inguinal area) AND [2] painful  Negative: [1] Swelling is painful to touch AND [2] no fever  Negative: Looks like a boil, infected sore, deep ulcer or other infected rash  Answer Assessment - Initial Assessment Questions 1. APPEARANCE of SWELLING: What does it look like? (e.g., lymph node, insect bite, mole) cyst to tailbone without pain 2. SIZE: How large is the swelling? (inches, cm or compare to coins) grape sized area 3. LOCATION: Where is the swelling located? tailbone 4. ONSET: When did the swelling start? a year ago 5. PAIN: Is it painful? If so, ask: How much? no 6. ITCH: Does it itch? If so, ask: How much? no 7. CAUSE: What do you think caused the swelling? yes 8. OTHER SYMPTOMS: Do you have any other symptoms? (e.g., fever) no  Protocols used: Skin Lump or Localized Swelling-A-AH  "

## 2024-05-13 ENCOUNTER — Telehealth: Payer: Self-pay

## 2024-05-13 NOTE — Telephone Encounter (Signed)
 Received message on after hours line.  Attempted to return call but no answer.  LVM to return call to clinic.

## 2024-05-18 ENCOUNTER — Other Ambulatory Visit: Payer: Self-pay | Admitting: Family Medicine

## 2024-05-18 ENCOUNTER — Ambulatory Visit: Admission: RE | Admit: 2024-05-18 | Source: Ambulatory Visit

## 2024-05-18 ENCOUNTER — Ambulatory Visit
Admission: RE | Admit: 2024-05-18 | Discharge: 2024-05-18 | Disposition: A | Discharge status: Home / Self Care | Source: Ambulatory Visit | Attending: Family Medicine | Admitting: Family Medicine

## 2024-05-18 DIAGNOSIS — O200 Threatened abortion: Secondary | ICD-10-CM | POA: Diagnosis present
